# Patient Record
Sex: Female | Born: 1980
Health system: Southern US, Community
[De-identification: ages and names within clinical notes are randomized; demographics above are authoritative.]

## PROBLEM LIST (undated history)

## (undated) DIAGNOSIS — G43909 Migraine, unspecified, not intractable, without status migrainosus: Secondary | ICD-10-CM

## (undated) HISTORY — PX: TUBAL LIGATION: SHX77

## (undated) HISTORY — PX: LEEP: SHX91

---

## 2010-07-22 ENCOUNTER — Inpatient Hospital Stay (HOSPITAL_COMMUNITY)
Admission: AD | Admit: 2010-07-22 | Discharge: 2010-07-24 | DRG: 767 | Disposition: A | Discharge status: Home / Self Care | Payer: Medicaid Other | Source: Ambulatory Visit | Attending: Obstetrics and Gynecology | Admitting: Obstetrics and Gynecology

## 2010-07-22 DIAGNOSIS — Z22330 Carrier of Group B streptococcus: Secondary | ICD-10-CM

## 2010-07-22 DIAGNOSIS — Z302 Encounter for sterilization: Secondary | ICD-10-CM

## 2010-07-22 DIAGNOSIS — O99892 Other specified diseases and conditions complicating childbirth: Principal | ICD-10-CM | POA: Diagnosis present

## 2010-07-22 LAB — CBC
Hemoglobin: 11.3 g/dL — ABNORMAL LOW (ref 12.0–15.0)
MCV: 86.3 fL (ref 78.0–100.0)
Platelets: 306 10*3/uL (ref 150–400)
RBC: 4.02 MIL/uL (ref 3.87–5.11)
WBC: 6.6 10*3/uL (ref 4.0–10.5)

## 2010-07-22 LAB — RPR: RPR Ser Ql: NONREACTIVE

## 2010-07-23 LAB — CBC
Hemoglobin: 10.2 g/dL — ABNORMAL LOW (ref 12.0–15.0)
MCH: 27.9 pg (ref 26.0–34.0)
RBC: 3.65 MIL/uL — ABNORMAL LOW (ref 3.87–5.11)

## 2010-07-24 ENCOUNTER — Other Ambulatory Visit: Payer: Self-pay | Admitting: Obstetrics and Gynecology

## 2010-07-28 ENCOUNTER — Inpatient Hospital Stay (HOSPITAL_COMMUNITY): Admission: AD | Admit: 2010-07-28 | Payer: Self-pay | Admitting: Obstetrics and Gynecology

## 2010-07-31 NOTE — Op Note (Signed)
  Wendy Robertson, Wendy Robertson                 ACCOUNT NO.:  [id number]  MEDICAL RECORD NO.:  [id number]           PATIENT TYPE:  I  LOCATION:  9114                          FACILITY:  [facility]  PHYSICIAN:  Crist Fat. Haruki Arnold, M.D. DATE OF BIRTH:  10/03/1980  DATE OF PROCEDURE:  07/22/2010 DATE OF DISCHARGE:                              OPERATIVE REPORT   PREOPERATIVE DIAGNOSIS:  Desire for sterilization.  POSTOPERATIVE DIAGNOSIS:  Desire for sterilization.  ANESTHESIA:  General, Dr. Cristela Blue.  PROCEDURE:  Postpartum bilateral tubal ligation.  SURGEON:  Crist Fat. Major Santerre, MD  No assistant.  ESTIMATED BLOOD LOSS:  Minimal.  PROCEDURE:  After being informed of the planned procedure with possible complications including bleeding, infection, injury to other organs as well as failure rate of 1 in 500, informed consent was obtained.  The patient was taken to OR #4, given general anesthesia with endotracheal intubation without any complication.  She was placed in the dorsal decubitus position, prepped and draped in a sterile fashion and a Foley catheter was inserted in her bladder.  We infiltrated the umbilical area with 10 mL of Marcaine 0.25 and performed a semi-elliptical incision which was brought down bluntly to the fascia.  The fascia was identified, grasped with Kocher forceps, incised with Mayo scissors.  Peritoneum was entered bluntly.  We identified initially the left tube which was grabbed with a Babcock forceps, and completely exteriorized until the fimbriae was identified. We then opened the mesosalpinx with cauterization, doubly ligated the proximal stump, doubly ligated the distal stump with 0 chromic and removed a section of 1.5 cm of the isthmic ampullary area.  Both stumps were then cauterized, hemostasis was adequate.  We proceeded in the same fashion on the right exteriorizing the tube until the fimbriae was identified, opening the mesosalpinx with cauterization doubly  ligating the proximal and doubly ligating the distal portion with 0 chromic and cauterizing both stumps.  Hemostasis was also adequate.  The fascia was then closed with a running suture of 0 Vicryl and the skin was closed with Dermabond.  Instruments and sponge count was complete x2.  Estimated blood loss was minimal.  The procedure was well tolerated by the patient who was taken to recovery room in a well and stable condition.  SPECIMEN:  Segments of tube sent to Pathology.     Crist Fat Annelyse Rey, M.D.     SAR/MEDQ  D:  07/22/2010  T:  07/23/2010  Job:  [id number]  Electronically Signed by Silverio Lay M.D. on 07/31/2010 10:39:05 AM

## 2010-08-10 NOTE — Discharge Summary (Signed)
  Wendy Robertson, Wendy Robertson                 ACCOUNT NO.:  [id number]  MEDICAL RECORD NO.:  [id number]           PATIENT TYPE:  I  LOCATION:  9114                          FACILITY:  [facility]  PHYSICIAN:  Osborn Coho, M.D.   DATE OF BIRTH:  [birth year]  DATE OF ADMISSION:  07/22/2010 DATE OF DISCHARGE:  07/24/2010                              DISCHARGE SUMMARY   ADMITTING DIAGNOSIS:  The patient is a 30 year old gravida 7, para 4-1-1- 4, who presents at 65 weeks' in active labor.  The patient was on 32 PE during her pregnancy.  DISCHARGE DIAGNOSIS:  Status post spontaneous vaginal delivery of a viable female infant named Alexis by Elsie Ra, certified nurse midwife, Apgars were 9 and 9, weight was 8 pounds 8 ounces.  The patient then received a bilateral tubal ligation per Dr. Estanislado Pandy.  HOSPITAL COURSE:  Postpartum course was routine, although the patient did have some drainage from her incision site this appears to have resolved.  The patient was stable upon discharge.  Her instructions are per [facility] booklet.  DISCHARGE MEDICATIONS:  Motrin and Percocet as well as Sudafed and a prenatal vitamin.    ______________________________ Sanda Klein, CNM   ______________________________ Osborn Coho, M.D.    SL/MEDQ  D:  07/24/2010  T:  07/25/2010  Job:  [id number]  Electronically Signed by Sanda Klein CNM on 07/30/2010 12:11:20 AM Electronically Signed by Osborn Coho M.D. on 08/10/2010 09:50:17 AM

## 2011-09-16 ENCOUNTER — Emergency Department (HOSPITAL_COMMUNITY)
Admission: EM | Admit: 2011-09-16 | Discharge: 2011-09-16 | Disposition: A | Discharge status: Home / Self Care | Payer: 59 | Source: Home / Self Care | Attending: Emergency Medicine | Admitting: Emergency Medicine

## 2011-09-16 ENCOUNTER — Encounter (HOSPITAL_COMMUNITY): Payer: Self-pay | Admitting: Emergency Medicine

## 2011-09-16 DIAGNOSIS — S8390XA Sprain of unspecified site of unspecified knee, initial encounter: Secondary | ICD-10-CM

## 2011-09-16 DIAGNOSIS — IMO0002 Reserved for concepts with insufficient information to code with codable children: Secondary | ICD-10-CM

## 2011-09-16 HISTORY — DX: Migraine, unspecified, not intractable, without status migrainosus: G43.909

## 2011-09-16 MED ORDER — IBUPROFEN 600 MG PO TABS: 600.0000 mg | ORAL_TABLET | Freq: Four times a day (QID) | ORAL | Status: AC | PRN

## 2011-09-16 MED ORDER — HYDROCODONE-ACETAMINOPHEN 5-325 MG PO TABS: 2.0000 | ORAL_TABLET | ORAL | Status: AC | PRN

## 2011-09-16 NOTE — Discharge Instructions (Signed)
Take the medication as written. Take 1 gram of tylenol with the motrin up to 4 times a day as needed for pain and fever. This is an effective combination for pain. Take the hydrocodone/norco only for severe pain. Do not take the tylenol and hydrocodone/norcoas they both have tylenol in them and too much can hurt your liver. Return if you get worse, have a  fever >100.4, or for any concerns.   Go to www.goodrx.com to look up your medications. This will give you a list of where you can find your prescriptions at the most affordable prices.   

## 2011-09-16 NOTE — ED Provider Notes (Signed)
History     CSN: [id number]  Arrival date & time 09/16/11  1632   First MD Initiated Contact with Patient 09/16/11 1646      Chief Complaint  Patient presents with  . Leg Pain    (Consider location/radiation/quality/duration/timing/severity/associated sxs/prior treatment) HPI Comments: Patient states that she stepped in a hole yesterday, and fell. States she was trying to get her foot out of the hole, and felt a "pop" in the middle of her calf. Since then, reports pain with dorsi flexion, active inversion /eversion of the ankle. Pain is worse with ambulation. Reports that her foot feels like it is "going to sleep" when she is walking, or has her foot in a dependent position. This is better with elevation. She has tried ice. No calf swelling, foot pain, ankle pain, knee pain. No redness, bruising. No history of injury to the foot, ankle, knee. Patient is not taking medications a regular basis, but has not been on any antibiotics recently.   Patient is a 31 y.o. female presenting with leg pain. The history is provided by the patient. No language interpreter was used.  Leg Pain  The incident occurred yesterday. The incident occurred at home. The injury mechanism was a fall. The pain is present in the left leg. The quality of the pain is described as aching. Pertinent negatives include no numbness, no inability to bear weight, no loss of motion, no muscle weakness, no loss of sensation and no tingling. The symptoms are aggravated by activity, bearing weight and palpation. She has tried ice for the symptoms. The treatment provided mild relief.    Past Medical History  Diagnosis Date  . Migraines     Past Surgical History  Procedure Date  . Leep   . Tubal ligation     History reviewed. No pertinent family history.  History  Substance Use Topics  . Smoking status: Never Smoker   . Smokeless tobacco: Not on file  . Alcohol Use: No    OB History    Grav Para Term Preterm Abortions  TAB SAB Ect Mult Living                  Review of Systems  Constitutional: Negative for fever.  Gastrointestinal: Negative for nausea and vomiting.  Musculoskeletal: Positive for myalgias. Negative for joint swelling.  Skin: Negative for color change, pallor, rash and wound.  Neurological: Negative for tingling, weakness and numbness.    Allergies  Review of patient's allergies indicates no known allergies.  Home Medications   Current Outpatient Rx  Name Route Sig Dispense Refill  . HYDROCODONE-ACETAMINOPHEN 5-325 MG PO TABS Oral Take 2 tablets by mouth every 4 (four) hours as needed for pain. 20 tablet 0  . IBUPROFEN 600 MG PO TABS Oral Take 1 tablet (600 mg total) by mouth every 6 (six) hours as needed for pain. 30 tablet 0    BP 130/91  Pulse 85  Temp(Src) 98.4 F (36.9 C) (Oral)  Resp 17  SpO2 99%  LMP 08/28/2011  Physical Exam  Nursing note and vitals reviewed. Constitutional: She is oriented to person, place, and time. She appears well-developed and well-nourished. No distress.  HENT:  Head: Normocephalic and atraumatic.  Eyes: Conjunctivae and EOM are normal.  Neck: Normal range of motion.  Cardiovascular: Normal rate.   Pulmonary/Chest: Effort normal.  Abdominal: She exhibits no distension.  Musculoskeletal: Normal range of motion.       Left lower leg: She exhibits tenderness. She exhibits  no bony tenderness, no swelling and no edema.       Legs:      Calves symmetric. Pain with deep palpation midcalf, between the gastrocnemius muscles. Pain with dorsiflexion. No pain with plantar flexion. Minimal pain with passive ankle inversion/eversion. Thompson test negative. DP 2+. Sensation to light touch and temperature intact. tibia, fibula  nontender. No bruising, discoloration. Knee, ankle, foot exam normal.   Neurological: She is alert and oriented to person, place, and time.  Skin: Skin is warm and dry.  Psychiatric: She has a normal mood and affect. Her behavior  is normal. Judgment and thought content normal.    ED Course  Procedures (including critical care time)  Labs Reviewed - No data to display No results found.   1. Sprain of lower leg       MDM  Suspect soleus injury. No evidence of Achilles tendon, ankle, knee injury. Place in ASO to provide proprioceptive feedback, and will have her followup with [facility] for further evaluation. Home with NSAIDs, Norco. Patient agrees with plan.  Luiz Blare, MD 09/16/11 1816

## 2011-09-16 NOTE — ED Notes (Signed)
Stepped in a hole in the yard yesterday.  Felt pop in lower leg.  Has pain between knee, ankle , and calf of leg.  Pain has worsened today, the more active, the more pain

## 2011-09-19 ENCOUNTER — Encounter: Payer: Self-pay | Admitting: Sports Medicine

## 2011-09-19 ENCOUNTER — Ambulatory Visit (INDEPENDENT_AMBULATORY_CARE_PROVIDER_SITE_OTHER): Payer: 59 | Admitting: Sports Medicine

## 2011-09-19 VITALS — BP 129/88 | HR 92

## 2011-09-19 DIAGNOSIS — S86819A Strain of other muscle(s) and tendon(s) at lower leg level, unspecified leg, initial encounter: Secondary | ICD-10-CM

## 2011-09-19 DIAGNOSIS — S86119A Strain of other muscle(s) and tendon(s) of posterior muscle group at lower leg level, unspecified leg, initial encounter: Secondary | ICD-10-CM | POA: Insufficient documentation

## 2011-09-19 NOTE — Assessment & Plan Note (Signed)
Compression sleeve. Heel lift. Rehabilitation exercises. Continue analgesics as prescribed by urgent care. Return to clinic 3 weeks.

## 2011-09-19 NOTE — Progress Notes (Signed)
  Subjective:    Patient ID: Wendy Robertson, female    DOB: [birth year], 31 y.o.   MRN: [id number]  HPI This patient is a very pleasant 31 year old female who comes in after stepping in a hole several days ago. She experienced a hyperdorsiflexion injury, and felt a pop at her medial calf. She went to urgent care, was given an ankle stabilizing orthosis, as well as some analgesics. She's here for further evaluation. She localizes her pain to the musculotendinous junction of the medial head of the gastrocnemius, and notes it's worse with resisted plantar flexion.  Past medical history: None. Past surgical history: LEEP, bilateral tubal ligation. Family history: Positive for diabetes, and hypertension. Social history: Denies use of alcohol, tobacco, or drugs. Works in [employer]. Allergies: None.  Review of Systems    No fevers, chills, night sweats, weight loss, chest pain, or shortness of breath.  Social History: Non-smoker. Objective:   Physical Exam General:  Well developed, well nourished, and in no acute distress. Neuro:  Alert and oriented x3, extra-ocular muscles intact. Skin: Warm and dry, no rashes noted. Respiratory:  Not using accessory muscles, speaking in full sentences. Musculoskeletal: Left spaceAnkle: No visible erythema or swelling. Range of motion is full in all directions. Strength is 5/5 in all directions. Stable lateral and medial ligaments; squeeze test and kleiger test unremarkable; Talar dome nontender; No pain at base of 5th MT; No tenderness over cuboid; No tenderness over N spot or navicular prominence No tenderness on posterior aspects of lateral and medial malleolus No sign of peroneal tendon subluxations or tenderness to palpation Negative tarsal tunnel tinel's  Tender to palpation at the musculotendinous junction of the medial head of the gastrocnemius. Good strength to plantar flexion. Negative Thompson's test.  MSK ultrasound: Shows a large  split in the soleus muscle medially just deep to the musculotendinous junction of the gastrocnemius.     Assessment & Plan:

## 2011-10-17 ENCOUNTER — Ambulatory Visit (INDEPENDENT_AMBULATORY_CARE_PROVIDER_SITE_OTHER): Payer: 59 | Admitting: Sports Medicine

## 2011-10-17 ENCOUNTER — Encounter: Payer: Self-pay | Admitting: Sports Medicine

## 2011-10-17 VITALS — BP 122/87 | HR 105 | Ht 62.0 in | Wt 160.0 lb

## 2011-10-17 DIAGNOSIS — S86119A Strain of other muscle(s) and tendon(s) of posterior muscle group at lower leg level, unspecified leg, initial encounter: Secondary | ICD-10-CM

## 2011-10-17 DIAGNOSIS — S93409A Sprain of unspecified ligament of unspecified ankle, initial encounter: Secondary | ICD-10-CM | POA: Insufficient documentation

## 2011-10-17 DIAGNOSIS — S838X9A Sprain of other specified parts of unspecified knee, initial encounter: Secondary | ICD-10-CM

## 2011-10-17 MED ORDER — MELOXICAM 15 MG PO TABS: ORAL_TABLET | ORAL | Status: DC

## 2011-10-17 NOTE — Progress Notes (Signed)
Patient ID: Wendy Robertson, female   DOB: 11-26-1980, 31 y.o.   MRN: [id number]  Subjective:   CC: Followup of left soleus strain.  HPI: Wendy Robertson comes back, her calf was 100% better, unfortunately 3 days ago she slipped and fell twisting her left ankle, and reinjuring her calf. She localizes her ankle pain over the ATFL, and is worse with any type of weightbearing. There is no swelling, no radiation, the pain is sharp.    Past medical history, surgical history, family history, social history, allergies, and medications reviewed from the medical record and no changes needed.  Review of Systems: No fevers, chills, night sweats, weight loss, chest pain, or shortness of breath.    Objective:  General:  Well Developed, well nourished, and in no acute distress. Neuro:  Alert and oriented x3, extra-ocular muscles intact. Skin: Warm and dry, no rashes noted. Respiratory:  Not using accessory muscles, speaking in full sentences. Musculoskeletal: Calf is no longer tender to palpation at the gastro-soleus musculotendinous junction. Left Ankle: No visible erythema or swelling. Range of motion is full in all directions. Strength is 5/5 in all directions. Stable lateral and medial ligaments; squeeze test and kleiger test unremarkable; Talar dome nontender; No pain at base of 5th MT; No tenderness over cuboid; No tenderness over N spot or navicular prominence No tenderness on posterior aspects of lateral and medial malleolus No sign of peroneal tendon subluxations or tenderness to palpation Negative tarsal tunnel tinel's Able to walk 4 steps. Mild tenderness to palpation over the ATFL. Negative anterior drawer sign.   Assessment & Plan:

## 2011-10-17 NOTE — Assessment & Plan Note (Signed)
Got approx 100% better then slipped and reinjured. Cont calf sleeve. Adding meloxicam. RTC 4 weeks prn.

## 2011-10-17 NOTE — Assessment & Plan Note (Signed)
Recent s/p fall, 3d ago. ASO. Ankle rehab. Meloxicam. RTC 3-4 weeks prn for this.

## 2011-11-20 ENCOUNTER — Ambulatory Visit: Payer: 59 | Admitting: Sports Medicine

## 2011-12-05 ENCOUNTER — Encounter: Payer: Self-pay | Attending: "Endocrinology | Admitting: *Deleted

## 2011-12-05 DIAGNOSIS — Z713 Dietary counseling and surveillance: Secondary | ICD-10-CM

## 2011-12-11 NOTE — Progress Notes (Signed)
Patient attended the [facility]: Hypertension/High Cholesterol nutrition class on 12/05/11.  Topics covered include:   1. Complications of Hyperlipidemia and/or Hypertension. 2. Ways to reduce risk of heart disease.  3. Identifying fat and sodium content on food labels. 4. Ways to decrease sodium intake. 5. Optimal amount of daily saturated fat intake. 6. Optimal amount of daily sodium intake.  7. Foods to limit/avoid on a heart healthy diet.  Patient to follow-up with [facility] prn.  

## 2012-01-08 ENCOUNTER — Ambulatory Visit: Payer: 59 | Admitting: Obstetrics and Gynecology

## 2012-02-01 ENCOUNTER — Encounter: Payer: Self-pay | Admitting: Obstetrics and Gynecology

## 2012-02-01 ENCOUNTER — Ambulatory Visit (INDEPENDENT_AMBULATORY_CARE_PROVIDER_SITE_OTHER): Payer: 59 | Admitting: Obstetrics and Gynecology

## 2012-02-01 VITALS — BP 110/70 | HR 70 | Resp 16 | Ht 62.0 in | Wt 164.0 lb

## 2012-02-01 DIAGNOSIS — N643 Galactorrhea not associated with childbirth: Secondary | ICD-10-CM

## 2012-02-01 DIAGNOSIS — Z01419 Encounter for gynecological examination (general) (routine) without abnormal findings: Secondary | ICD-10-CM

## 2012-02-01 DIAGNOSIS — Z124 Encounter for screening for malignant neoplasm of cervix: Secondary | ICD-10-CM

## 2012-02-01 DIAGNOSIS — Z139 Encounter for screening, unspecified: Secondary | ICD-10-CM

## 2012-02-01 LAB — CBC
MCH: 29.7 pg (ref 26.0–34.0)
MCHC: 32.9 g/dL (ref 30.0–36.0)
MCV: 90.2 fL (ref 78.0–100.0)
Platelets: 365 10*3/uL (ref 150–400)
RDW: 14.5 % (ref 11.5–15.5)
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
## 2012-02-04 LAB — PAP IG W/ RFLX HPV ASCU

## 2012-06-28 ENCOUNTER — Other Ambulatory Visit: Payer: Self-pay

## 2012-09-01 ENCOUNTER — Emergency Department (HOSPITAL_COMMUNITY): Payer: 59

## 2012-09-01 ENCOUNTER — Emergency Department (HOSPITAL_COMMUNITY)
Admission: EM | Admit: 2012-09-01 | Discharge: 2012-09-01 | Disposition: A | Discharge status: Home / Self Care | Payer: 59 | Attending: Emergency Medicine | Admitting: Emergency Medicine

## 2012-09-01 ENCOUNTER — Encounter (HOSPITAL_COMMUNITY): Payer: Self-pay | Admitting: *Deleted

## 2012-09-01 DIAGNOSIS — R079 Chest pain, unspecified: Secondary | ICD-10-CM

## 2012-09-01 DIAGNOSIS — Z8679 Personal history of other diseases of the circulatory system: Secondary | ICD-10-CM | POA: Insufficient documentation

## 2012-09-01 DIAGNOSIS — Y9389 Activity, other specified: Secondary | ICD-10-CM | POA: Insufficient documentation

## 2012-09-01 DIAGNOSIS — IMO0002 Reserved for concepts with insufficient information to code with codable children: Secondary | ICD-10-CM | POA: Insufficient documentation

## 2012-09-01 DIAGNOSIS — Y929 Unspecified place or not applicable: Secondary | ICD-10-CM | POA: Insufficient documentation

## 2012-09-01 LAB — POCT I-STAT, CHEM 8
Creatinine, Ser: 0.6 mg/dL (ref 0.50–1.10)
Glucose, Bld: 99 mg/dL (ref 70–99)
HCT: 43 % (ref 36.0–46.0)
Hemoglobin: 14.6 g/dL (ref 12.0–15.0)
TCO2: 24 mmol/L (ref 0–100)

## 2012-09-01 LAB — D-DIMER, QUANTITATIVE (NOT AT ARMC): D-Dimer, Quant: <0.27 ug/mL-FEU (ref 0.00–0.48)
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
## 2012-11-01 ENCOUNTER — Emergency Department (HOSPITAL_COMMUNITY)
Admission: EM | Admit: 2012-11-01 | Discharge: 2012-11-01 | Disposition: A | Discharge status: Home / Self Care | Payer: 59 | Source: Home / Self Care | Attending: Emergency Medicine | Admitting: Emergency Medicine

## 2012-11-01 ENCOUNTER — Emergency Department (INDEPENDENT_AMBULATORY_CARE_PROVIDER_SITE_OTHER): Payer: 59

## 2012-11-01 ENCOUNTER — Encounter (HOSPITAL_COMMUNITY): Payer: Self-pay | Admitting: Emergency Medicine

## 2012-11-01 DIAGNOSIS — M25579 Pain in unspecified ankle and joints of unspecified foot: Secondary | ICD-10-CM

## 2012-11-01 MED ORDER — HYDROCODONE-ACETAMINOPHEN 5-325 MG PO TABS: ORAL_TABLET | ORAL | Status: DC

## 2012-11-01 MED ORDER — IBUPROFEN 800 MG PO TABS
ORAL_TABLET | ORAL | Status: AC
  Filled 2012-11-01: qty 1

## 2012-11-01 MED ORDER — IBUPROFEN 800 MG PO TABS
800.0000 mg | ORAL_TABLET | Freq: Once | ORAL | Status: AC
  Administered 2012-11-01: 800 mg via ORAL

## 2012-11-01 MED ORDER — DICLOFENAC SODIUM 75 MG PO TBEC: 75.0000 mg | DELAYED_RELEASE_TABLET | Freq: Two times a day (BID) | ORAL | Status: DC

## 2012-11-01 NOTE — ED Notes (Signed)
Pt c/o bilateral ankle pain onset 6/13... Denies: inj/trauma... Reports recently having started to work out at the gym... Both ankles swell after a long day at work... She is alert and oriented w/no signs of acute distress.

## 2012-11-01 NOTE — ED Provider Notes (Signed)
Chief Complaint:   Chief Complaint  Patient presents with  . Ankle Pain    History of Present Illness:   Wendy Robertson is a 32 year old female who's had a one-week history of pain in both of her ankles, worse on the left than the right. She denies any injury, although she has been working out a lot, I think she may have been a little extra stress on her ankles and feet. She has swelling of both ankles although mostly on the left. It hurts to walk or to bear weight. It also hurts to move the ankle. She denies any other joint pains. She's had no systemic symptoms such as fever, chills, skin rash, adenopathy, conjunctivitis, abdominal pain, diarrhea, or urinary symptoms.  Review of Systems:  Other than noted above, the patient denies any of the following symptoms: Systemic:  No fevers, chills, sweats, or aches.  No fatigue or tiredness. Musculoskeletal:  No joint pain, arthritis, bursitis, swelling, back pain, or neck pain. Neurological:  No muscular weakness, paresthesias, headache, or trouble with speech or coordination.  No dizziness.  PMFSH:  Past medical history, family history, social history, meds, and allergies were reviewed.    Physical Exam:   Vital signs:  BP 149/72  Pulse 90  Temp(Src) 98.7 F (37.1 C) (Oral)  Resp 14  SpO2 96%  LMP 10/09/2012 Gen:  Alert and oriented times 3.  In no distress. Musculoskeletal: There was no visible swelling of the ankles, erythema, or heat. There is tenderness to palpation over the ankle joints, left more so than right. It hurts to move the left ankle joint and she hasn't decreased range of motion with pain. She has minor pain on movement of the right ankle joint and a full range of motion on that side.  Otherwise, all joints had a full a ROM with no swelling, bruising or deformity.  No edema, pulses full. Extremities were warm and pink.  Capillary refill was brisk.  Skin:  Clear, warm and dry.  No rash. Neuro:  Alert and oriented times 3.  Muscle  strength was normal.  Sensation was intact to light touch.   Radiology:  Dg Ankle Complete Left  11/01/2012   *RADIOLOGY REPORT*  Clinical Data: Ankle pain  LEFT ANKLE COMPLETE - 3+ VIEW  Comparison: None.  Findings: There is no evidence of fracture or dislocation.  There is no evidence of arthropathy or other focal bone abnormality. Soft tissues are unremarkable.  IMPRESSION: Negative exam.   Original Report Authenticated By: Signa Kell, M.D.   Dg Ankle Complete Right  11/01/2012   *RADIOLOGY REPORT*  Clinical Data:  Right ankle pain.  RIGHT ANKLE - COMPLETE 3+ VIEW  Comparison:  None.  Findings:  There is no evidence of fracture, dislocation, or joint effusion.  There is no evidence of arthropathy or other focal bone abnormality.  Soft tissues are unremarkable.  IMPRESSION: Negative.   Original Report Authenticated By: Irish Lack, M.D.   I reviewed the images independently and personally and concur with the radiologist's findings.  Course in Urgent Care Center:   Given ibuprofen 800 mg by mouth for the pain since she will be driving herself home. She was placed in a Cam Walker on the left side and given crutches for ambulation.  Assessment:  The encounter diagnosis was Ankle pain, unspecified laterality.  Ankle pain of uncertain cause possibly arthritis such as Reiter's syndrome, or other spondyloarthropathy, tendinitis, or stress fracture. She will need further evaluation and plans to see  her sports medicine specialist, Dr. Roanna Epley early next week.  Plan:   1.  The following meds were prescribed:   New Prescriptions   DICLOFENAC (VOLTAREN) 75 MG EC TABLET    Take 1 tablet (75 mg total) by mouth 2 (two) times daily.   HYDROCODONE-ACETAMINOPHEN (NORCO/VICODIN) 5-325 MG PER TABLET    1 to 2 tabs every 4 to 6 hours as needed for pain.   2.  The patient was instructed in symptomatic care, including rest and activity, elevation, application of ice and compression.  Appropriate handouts  were given. 3.  The patient was told to return if becoming worse in any way, if no better in 3 or 4 days, and given some red flag symptoms such as worsening pain or fever that would indicate earlier return.   4.  The patient was told to follow up with Dr. Darrick Penna next week.    Reuben Likes, MD 11/01/12 1345

## 2012-11-05 ENCOUNTER — Ambulatory Visit (INDEPENDENT_AMBULATORY_CARE_PROVIDER_SITE_OTHER): Payer: 59 | Admitting: Sports Medicine

## 2012-11-05 ENCOUNTER — Encounter: Payer: Self-pay | Admitting: Sports Medicine

## 2012-11-05 VITALS — BP 124/80 | HR 97 | Ht 62.0 in | Wt 166.0 lb

## 2012-11-05 DIAGNOSIS — IMO0002 Reserved for concepts with insufficient information to code with codable children: Secondary | ICD-10-CM

## 2012-11-05 DIAGNOSIS — I83893 Varicose veins of bilateral lower extremities with other complications: Secondary | ICD-10-CM | POA: Insufficient documentation

## 2012-11-05 DIAGNOSIS — S86119S Strain of other muscle(s) and tendon(s) of posterior muscle group at lower leg level, unspecified leg, sequela: Secondary | ICD-10-CM

## 2012-11-05 DIAGNOSIS — M79609 Pain in unspecified limb: Secondary | ICD-10-CM

## 2012-11-05 DIAGNOSIS — M79661 Pain in right lower leg: Secondary | ICD-10-CM | POA: Insufficient documentation

## 2012-11-05 NOTE — Assessment & Plan Note (Signed)
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

## 2012-11-05 NOTE — Progress Notes (Signed)
Patient ID: Wendy Robertson, female   DOB: 03/26/1981, 32 y.o.   MRN: [id number]  Patient completed an insanity workout at home. She were not getting bilateral ankle pain and swelling. [facility]. She was put in a Cam Walker because her pain particularly on her left ankle and leg was pretty intense. This has gradually lessened over the last week. She comes today for evaluation.  Last year I had seen her for a tear of her soleus muscle on the left that was fairly large. That one arose after some step up exercises. She did get better fairly quickly from that injury.  Possible pertinent history is that she's had 5 children with the last being 59 years of age. She has had trouble maintaining fitness with her busy schedule. Weight is significantly above her baseline. There is a history of significant varicose veins particularly in her grandmother.  Examination  No acute distress  Ankle bilaterally Mild  swelling. Range of motion is full in all directions. Strength is 5/5 in all directions. Stable lateral and medial ligaments; squeeze test and kleiger test unremarkable; Talar dome nontender; No pain at base of 5th MT; No tenderness over cuboid; No tenderness over N spot or navicular prominence No tenderness on posterior aspects of lateral and medial malleolus. There is more swelling over the posterior tibial tendon sheaths than is noted laterally No sign of peroneal tendon subluxations; Negative tarsal tunnel tinel's Able to walk 4 steps.  She still has some pain when stepping up on a step with either leg.  No palpable tenderness in the calf. Varicosities are noted in both calves.  Ultrasound examination  Medial side of the lower leg as the soleus muscle there is a significant varicosity bilaterally There is hypoechoic change at the medial and lateral he had of the gastrocnemius muscle on the left There is hypoechoic change / fluid outlining the fascial  layer deep to the medial gastroc on the left The soleus muscle looks intact but has some hypoechoic change in the lateral aspect of the left There are scattered varicosities Achilles tendon is intact  On right calf the medial and lateral gastroc look intact The soleus and Achilles tendon look intact There are scattered varicosities  Swelling is noted into the posterior tibialis sheath bilaterally

## 2012-11-05 NOTE — Assessment & Plan Note (Signed)
Patient has had problems with the calf muscles on left 4 year and now having trouble with the right calf as well  I think the ankle swelling came from the calf injury She does have evidence of muscle strain I am concerned that it may be the varicosities that get injured and caused some bleeding into her calves on both sides  We will place compression sleeves on both calves Heel lifts Start with gentle rehabilitation exercises and progress is as tolerated Recheck in 6 weeks

## 2012-12-10 ENCOUNTER — Ambulatory Visit: Payer: 59 | Admitting: Sports Medicine

## 2013-02-04 ENCOUNTER — Encounter (HOSPITAL_COMMUNITY): Payer: Self-pay | Admitting: Emergency Medicine

## 2013-02-04 ENCOUNTER — Emergency Department (HOSPITAL_COMMUNITY)
Admission: EM | Admit: 2013-02-04 | Discharge: 2013-02-04 | Disposition: A | Discharge status: Home / Self Care | Payer: 59 | Source: Home / Self Care | Attending: Family Medicine | Admitting: Family Medicine

## 2013-02-04 DIAGNOSIS — N390 Urinary tract infection, site not specified: Secondary | ICD-10-CM

## 2013-02-04 LAB — POCT URINALYSIS DIP (DEVICE)
Bilirubin Urine: NEGATIVE
Ketones, ur: NEGATIVE mg/dL
Protein, ur: 100 mg/dL — AB
Specific Gravity, Urine: >=1.03 (ref 1.005–1.030)
pH: 6 (ref 5.0–8.0)

## 2013-02-04 MED ORDER — ONDANSETRON 4 MG PO TBDP
ORAL_TABLET | ORAL | Status: AC
  Filled 2013-02-04: qty 1

## 2013-02-04 MED ORDER — CEFTRIAXONE SODIUM 1 G IJ SOLR
INTRAMUSCULAR | Status: AC
  Filled 2013-02-04: qty 10

## 2013-02-04 MED ORDER — ONDANSETRON 4 MG PO TBDP
4.0000 mg | ORAL_TABLET | Freq: Once | ORAL | Status: AC
  Administered 2013-02-04: 4 mg via ORAL

## 2013-02-04 MED ORDER — CEFTRIAXONE SODIUM 1 G IJ SOLR
1.0000 g | Freq: Once | INTRAMUSCULAR | Status: AC
  Administered 2013-02-04: 1 g via INTRAMUSCULAR

## 2013-02-04 MED ORDER — CEPHALEXIN 500 MG PO CAPS: 500.0000 mg | ORAL_CAPSULE | Freq: Four times a day (QID) | ORAL | Status: DC

## 2013-02-04 MED ORDER — LIDOCAINE HCL (PF) 1 % IJ SOLN
INTRAMUSCULAR | Status: AC
  Filled 2013-02-04: qty 5

## 2013-02-04 NOTE — ED Notes (Signed)
Pt c/o lower back pain. Denies n/v/d. Pt is alert and oriented.

## 2013-02-04 NOTE — ED Provider Notes (Signed)
CSN: [id number]     Arrival date & time 02/04/13  1834 History   First MD Initiated Contact with Patient 02/04/13 1923     No chief complaint on file.  (Consider location/radiation/quality/duration/timing/severity/associated sxs/prior Treatment) Patient is a 32 y.o. female presenting with back pain.  Back Pain Location:  Lumbar spine Quality:  Stabbing Radiates to:  Does not radiate Pain severity:  Moderate Onset quality:  Sudden Duration:  3 hours Timing:  Constant Chronicity:  New Context: not recent illness and not recent injury   Associated symptoms: no abdominal pain, no chest pain, no dysuria, no fever and no pelvic pain     Past Medical History  Diagnosis Date  . Migraines    Past Surgical History  Procedure Laterality Date  . Leep    . Tubal ligation     No family history on file. History  Substance Use Topics  . Smoking status: Never Smoker   . Smokeless tobacco: Never Used  . Alcohol Use: No   OB History   Grav Para Term Preterm Abortions TAB SAB Ect Mult Living                 Review of Systems  Constitutional: Negative.  Negative for fever.  HENT: Negative.   Cardiovascular: Negative for chest pain.  Gastrointestinal: Positive for nausea. Negative for vomiting and abdominal pain.  Genitourinary: Positive for flank pain. Negative for dysuria, frequency, hematuria, vaginal bleeding, vaginal discharge, menstrual problem and pelvic pain.  Musculoskeletal: Negative for back pain.    Allergies  Review of patient's allergies indicates no known allergies.  Home Medications   Current Outpatient Rx  Name  Route  Sig  Dispense  Refill  . cephALEXin (KEFLEX) 500 MG capsule   Oral   Take 1 capsule (500 mg total) by mouth 4 (four) times daily. Take all of medicine and drink lots of fluids   20 capsule   0   . diclofenac (VOLTAREN) 75 MG EC tablet   Oral   Take 1 tablet (75 mg total) by mouth 2 (two) times daily.   20 tablet   0    There were no  vitals taken for this visit. Physical Exam  Nursing note and vitals reviewed. Constitutional: She is oriented to person, place, and time. She appears well-developed and well-nourished.  Neck: Normal range of motion. Neck supple.  Pulmonary/Chest: Breath sounds normal.  Abdominal: Soft. Normal appearance and bowel sounds are normal. There is no hepatosplenomegaly. There is tenderness. There is CVA tenderness. There is no rigidity, no rebound, no guarding, no tenderness at McBurney's point and negative Murphy's sign.  Neurological: She is alert and oriented to person, place, and time.  Skin: Skin is warm and dry.    ED Course  Procedures (including critical care time) Labs Review Labs Reviewed  POCT URINALYSIS DIP (DEVICE) - Abnormal; Notable for the following:    Hgb urine dipstick MODERATE (*)    Protein, ur 100 (*)    Leukocytes, UA MODERATE (*)    All other components within normal limits  URINE CULTURE   Imaging Review No results found.  MDM      Linna Hoff, MD 02/04/13 [phone]

## 2013-02-06 LAB — URINE CULTURE: Special Requests: NORMAL

## 2013-02-07 NOTE — ED Notes (Signed)
Patient final report of UA culture and sens. Shows klebsiella pneumonia, sens. To Rocephin and keflex rx administered to patient.

## 2013-03-19 ENCOUNTER — Other Ambulatory Visit: Payer: Self-pay

## 2013-09-03 ENCOUNTER — Encounter (HOSPITAL_COMMUNITY): Payer: Self-pay | Admitting: Emergency Medicine

## 2013-09-03 ENCOUNTER — Emergency Department (HOSPITAL_COMMUNITY)
Admission: EM | Admit: 2013-09-03 | Discharge: 2013-09-03 | Disposition: A | Discharge status: Home / Self Care | Payer: 59 | Attending: Emergency Medicine | Admitting: Emergency Medicine

## 2013-09-03 ENCOUNTER — Emergency Department (HOSPITAL_COMMUNITY)
Admission: EM | Admit: 2013-09-03 | Discharge: 2013-09-03 | Disposition: A | Discharge status: Other Acute Inpatient Hospital | Payer: 59 | Source: Home / Self Care | Attending: Family Medicine | Admitting: Family Medicine

## 2013-09-03 ENCOUNTER — Emergency Department (INDEPENDENT_AMBULATORY_CARE_PROVIDER_SITE_OTHER): Payer: 59

## 2013-09-03 DIAGNOSIS — R0789 Other chest pain: Secondary | ICD-10-CM

## 2013-09-03 DIAGNOSIS — R0682 Tachypnea, not elsewhere classified: Secondary | ICD-10-CM

## 2013-09-03 DIAGNOSIS — F419 Anxiety disorder, unspecified: Secondary | ICD-10-CM

## 2013-09-03 DIAGNOSIS — R079 Chest pain, unspecified: Secondary | ICD-10-CM

## 2013-09-03 DIAGNOSIS — R0602 Shortness of breath: Secondary | ICD-10-CM

## 2013-09-03 DIAGNOSIS — R209 Unspecified disturbances of skin sensation: Secondary | ICD-10-CM | POA: Insufficient documentation

## 2013-09-03 DIAGNOSIS — Z8679 Personal history of other diseases of the circulatory system: Secondary | ICD-10-CM | POA: Insufficient documentation

## 2013-09-03 DIAGNOSIS — F411 Generalized anxiety disorder: Secondary | ICD-10-CM | POA: Insufficient documentation

## 2013-09-03 LAB — BASIC METABOLIC PANEL
BUN: 12 mg/dL (ref 6–23)
CHLORIDE: 102 meq/L (ref 96–112)
CO2: 20 mEq/L (ref 19–32)
Calcium: 9.5 mg/dL (ref 8.4–10.5)
Creatinine, Ser: 0.57 mg/dL (ref 0.50–1.10)
GFR calc non Af Amer: >90 mL/min (ref >90)
Glucose, Bld: 89 mg/dL (ref 70–99)
Potassium: 3.8 mEq/L (ref 3.7–5.3)
Sodium: 138 mEq/L (ref 137–147)

## 2013-09-03 LAB — POCT I-STAT, CHEM 8
BUN: 12 mg/dL (ref 6–23)
CHLORIDE: 106 meq/L (ref 96–112)
CREATININE: 0.6 mg/dL (ref 0.50–1.10)
Calcium, Ion: 1.17 mmol/L (ref 1.12–1.23)
GLUCOSE: 97 mg/dL (ref 70–99)
HCT: 43 % (ref 36.0–46.0)
Hemoglobin: 14.6 g/dL (ref 12.0–15.0)
POTASSIUM: 3.8 meq/L (ref 3.7–5.3)
Sodium: 141 mEq/L (ref 137–147)
TCO2: 21 mmol/L (ref 0–100)

## 2013-09-03 LAB — CBC WITH DIFFERENTIAL/PLATELET
BASOS PCT: 0 % (ref 0–1)
Basophils Absolute: 0 10*3/uL (ref 0.0–0.1)
EOS ABS: 0.1 10*3/uL (ref 0.0–0.7)
Eosinophils Relative: 1 % (ref 0–5)
HEMATOCRIT: 40.8 % (ref 36.0–46.0)
HEMOGLOBIN: 13.8 g/dL (ref 12.0–15.0)
Lymphocytes Relative: 26 % (ref 12–46)
Lymphs Abs: 1.9 10*3/uL (ref 0.7–4.0)
MCH: 30.3 pg (ref 26.0–34.0)
MCHC: 33.8 g/dL (ref 30.0–36.0)
MCV: 89.7 fL (ref 78.0–100.0)
MONOS PCT: 6 % (ref 3–12)
Monocytes Absolute: 0.4 10*3/uL (ref 0.1–1.0)
Neutro Abs: 4.9 10*3/uL (ref 1.7–7.7)
Neutrophils Relative %: 67 % (ref 43–77)
Platelets: 366 10*3/uL (ref 150–400)
RBC: 4.55 MIL/uL (ref 3.87–5.11)
RDW: 14.1 % (ref 11.5–15.5)
WBC: 7.3 10*3/uL (ref 4.0–10.5)

## 2013-09-03 LAB — D-DIMER, QUANTITATIVE: D-Dimer, Quant: 0.27 ug/mL-FEU (ref 0.00–0.48)

## 2013-09-03 LAB — TROPONIN I: Troponin I: <0.3 ng/mL (ref <0.30)

## 2013-09-03 MED ORDER — LORAZEPAM 1 MG PO TABS
1.0000 mg | ORAL_TABLET | Freq: Once | ORAL | Status: AC
  Administered 2013-09-03: 1 mg via ORAL
  Filled 2013-09-03: qty 1

## 2013-09-03 MED ORDER — HYDROXYZINE HCL 25 MG PO TABS: 25.0000 mg | ORAL_TABLET | Freq: Four times a day (QID) | ORAL | Status: DC | PRN

## 2013-09-03 NOTE — ED Provider Notes (Signed)
CSN: [id number]     Arrival date & time 09/03/13  [id number] History   First MD Initiated Contact with Patient 09/03/13 [phone]     Chief Complaint  Patient presents with  . Shortness of Breath   (Consider location/radiation/quality/duration/timing/severity/associated sxs/prior Treatment) HPI Comments: 33 year old female presents complaining of shortness of breath. This began this morning at 6 AM, approximately 3 hours ago. She started feeling like her chest was tight and it was difficult to breathe. This feeling has persisted up until now. She is also had a slight feeling of weakness. She describes this as feeling like her entire body is very tired like she is just done an intense workout. The feelings get worse with any exertion such as walking around. She has had this once before in the past but it only lasted a few minutes so this is very abnormal for her. She has no significant past medical history. She has no significant family history. She has no recent travel or sick contacts. She denies nausea, vomiting, fever, chest pain, leg swelling  Patient is a 33 y.o. female presenting with shortness of breath.  Shortness of Breath Associated symptoms: no abdominal pain, no chest pain, no cough, no fever, no rash, no sore throat, no vomiting and no wheezing     Past Medical History  Diagnosis Date  . Migraines    Past Surgical History  Procedure Laterality Date  . Leep    . Tubal ligation     History reviewed. No pertinent family history. History  Substance Use Topics  . Smoking status: Never Smoker   . Smokeless tobacco: Never Used  . Alcohol Use: No   OB History   Grav Para Term Preterm Abortions TAB SAB Ect Mult Living                 Review of Systems  Constitutional: Negative for fever and chills.  HENT: Negative for sore throat.   Eyes: Negative for visual disturbance.  Respiratory: Positive for chest tightness and shortness of breath. Negative for cough and wheezing.    Cardiovascular: Negative for chest pain, palpitations and leg swelling.  Gastrointestinal: Negative for nausea, vomiting, abdominal pain, diarrhea and blood in stool.  Endocrine: Negative for polydipsia and polyuria.  Genitourinary: Negative for dysuria, urgency and frequency.  Musculoskeletal: Negative for arthralgias, myalgias and neck stiffness.  Skin: Negative for color change and rash.  Neurological: Positive for weakness. Negative for dizziness and light-headedness.  All other systems reviewed and are negative.   Allergies  Review of patient's allergies indicates no known allergies.  Home Medications   Prior to Admission medications   Medication Sig Start Date End Date Taking? Authorizing Provider  cephALEXin (KEFLEX) 500 MG capsule Take 1 capsule (500 mg total) by mouth 4 (four) times daily. Take all of medicine and drink lots of fluids 02/04/13   Linna HoffJames D Kindl, MD  diclofenac (VOLTAREN) 75 MG EC tablet Take 1 tablet (75 mg total) by mouth 2 (two) times daily. 11/01/12   Reuben Likesavid C Keller, MD   BP 143/64  Pulse 97  Temp(Src) 98.7 F (37.1 C) (Oral)  Resp 16  SpO2 100%  LMP 08/31/2013 Physical Exam  Nursing note and vitals reviewed. Constitutional: She is oriented to person, place, and time. Vital signs are normal. She appears well-developed and well-nourished. She is active.  Non-toxic appearance. She does not appear ill. No distress.  HENT:  Head: Normocephalic and atraumatic.  Right Ear: External ear normal.  Left Ear: External ear  normal.  Nose: Nose normal.  Mouth/Throat: Uvula is midline, oropharynx is clear and moist and mucous membranes are normal. No oropharyngeal exudate.  Neck: No JVD present.  Cardiovascular: Normal rate, regular rhythm, normal heart sounds, intact distal pulses and normal pulses.   Pulmonary/Chest: Breath sounds normal. No accessory muscle usage. Tachypnea noted. No respiratory distress.  Neurological: She is alert and oriented to person, place,  and time. She has normal strength and normal reflexes. No cranial nerve deficit or sensory deficit. She exhibits normal muscle tone. She displays a negative Romberg sign. Coordination and gait normal.  Left pupil is slightly dilated as compared to the right but is round and reactive to light equally bilaterally  Skin: Skin is warm and dry. No rash noted. She is not diaphoretic.  Psychiatric: She has a normal mood and affect. Judgment normal.    ED Course  Procedures (including critical care time) Labs Review Labs Reviewed  POCT I-STAT, CHEM 8    Results for orders placed during the hospital encounter of 09/03/13  POCT I-STAT, CHEM 8      Result Value Ref Range   Sodium 141  137 - 147 mEq/L   Potassium 3.8  3.7 - 5.3 mEq/L   Chloride 106  96 - 112 mEq/L   BUN 12  6 - 23 mg/dL   Creatinine, Ser 1.610.60  0.50 - 1.10 mg/dL   Glucose, Bld 97  70 - 99 mg/dL   Calcium, Ion 0.961.17  0.451.12 - 1.23 mmol/L   TCO2 21  0 - 100 mmol/L   Hemoglobin 14.6  12.0 - 15.0 g/dL   HCT 40.943.0  81.136.0 - 91.446.0 %   Imaging Review Dg Chest 2 View  09/03/2013   CLINICAL DATA:  Shortness of breath.  EXAM: CHEST  2 VIEW  COMPARISON:  09/01/2012  FINDINGS: The heart size and mediastinal contours are within normal limits. Both lungs are clear. The visualized skeletal structures are unremarkable.  IMPRESSION: No active cardiopulmonary disease.   Electronically Signed   By: Herbie BaltimoreWalt  Liebkemann M.D.   On: 09/03/2013 09:32    RR is 24   EKG  Date: 09/03/2013  Rate: 83  Rhythm: normal sinus rhythm  QRS Axis: normal  Intervals: normal  ST/T Wave abnormalities: normal  Conduction Disutrbances:none  Narrative Interpretation:   Old EKG Reviewed: unchanged    MDM   1. Shortness of breath   2. Tachypnea   3. Chest tightness     Patient with subjective shortness of breath I-STAT is normal, chest x-ray is normal, EKG is normal, cause undetermined. We'll transfer to the emergency department for further  evaluation.   Graylon GoodZachary H Susanna Benge, PA-C 09/03/13 [phone]

## 2013-09-03 NOTE — ED Provider Notes (Signed)
CSN: [id number]     Arrival date & time 09/03/13  1013 History   First MD Initiated Contact with Patient 09/03/13 1024     Chief Complaint  Patient presents with  . Shortness of Breath     (Consider location/radiation/quality/duration/timing/severity/associated sxs/prior Treatment) HPI Comments: Patient presents to the ER as a referral from urgent care. Patient reports that she awakened this morning with difficulty breathing. She feels a tightness in her chest and also reports that she feels like she cannot get air in and out appropriately. This has been ongoing since she awakened. She was here care, had an EKG and chest x-ray which were normal. She was referred to the ER for further evaluation. Upon arrival, symptoms are persistent.  Patient is a 33 y.o. female presenting with shortness of breath.  Shortness of Breath   Past Medical History  Diagnosis Date  . Migraines    Past Surgical History  Procedure Laterality Date  . Leep    . Tubal ligation     History reviewed. No pertinent family history. History  Substance Use Topics  . Smoking status: Never Smoker   . Smokeless tobacco: Never Used  . Alcohol Use: No   OB History   Grav Para Term Preterm Abortions TAB SAB Ect Mult Living                 Review of Systems  Respiratory: Positive for chest tightness and shortness of breath.   All other systems reviewed and are negative.     Allergies  Review of patient's allergies indicates no known allergies.  Home Medications   Prior to Admission medications   Not on File   BP 137/89  Pulse 90  Temp(Src) 97.7 F (36.5 C) (Oral)  Resp 16  Ht 5\' 3"  (1.6 m)  Wt 168 lb 12.8 oz (76.567 kg)  BMI 29.91 kg/m2  SpO2 100%  LMP 08/31/2013 Physical Exam  Constitutional: She is oriented to person, place, and time. She appears well-developed and well-nourished. No distress.  HENT:  Head: Normocephalic and atraumatic.  Right Ear: Hearing normal.  Left Ear: Hearing normal.   Nose: Nose normal.  Mouth/Throat: Oropharynx is clear and moist and mucous membranes are normal.  Eyes: Conjunctivae and EOM are normal. Pupils are equal, round, and reactive to light.  Neck: Normal range of motion. Neck supple.  Cardiovascular: Regular rhythm, S1 normal and S2 normal.  Exam reveals no gallop and no friction rub.   No murmur heard. Pulmonary/Chest: Breath sounds normal. Tachypnea noted. No respiratory distress. She exhibits no tenderness.  Abdominal: Soft. Normal appearance and bowel sounds are normal. There is no hepatosplenomegaly. There is no tenderness. There is no rebound, no guarding, no tenderness at McBurney's point and negative Murphy's sign. No hernia.  Musculoskeletal: Normal range of motion.  Neurological: She is alert and oriented to person, place, and time. She has normal strength. No cranial nerve deficit or sensory deficit. Coordination normal. GCS eye subscore is 4. GCS verbal subscore is 5. GCS motor subscore is 6.  Skin: Skin is warm, dry and intact. No rash noted. No cyanosis.  Psychiatric: She has a normal mood and affect. Her speech is normal and behavior is normal. Thought content normal.    ED Course  Procedures (including critical care time) Labs Review Labs Reviewed  CBC WITH DIFFERENTIAL  BASIC METABOLIC PANEL  TROPONIN I  D-DIMER, QUANTITATIVE    Imaging Review Dg Chest 2 View  09/03/2013   CLINICAL DATA:  Shortness  of breath.  EXAM: CHEST  2 VIEW  COMPARISON:  09/01/2012  FINDINGS: The heart size and mediastinal contours are within normal limits. Both lungs are clear. The visualized skeletal structures are unremarkable.  IMPRESSION: No active cardiopulmonary disease.   Electronically Signed   By: Herbie BaltimoreWalt  Liebkemann M.D.   On: 09/03/2013 09:32     EKG Interpretation None      MDM   Final diagnoses:  None    EKG from urgent care review, no acute ischemia or infarct. Chest x-ray also reviewed, was normal. Upon examination, lungs are  clear. She has normal air movement bilaterally with oxygenation of 100%. She appears extremely anxious, is slightly hyperventilating upon arrival.  Patient's troponin is negative. A d-dimer was also performed and is negative. Etiology of the symptoms is unclear at this time. She does not have any cardiac risk factors. Likewise, she is low risk for PE and has a negative d-dimer. Her Wells criteria were negative.  Patient now complaining intermittently of the right and then left hand and finger numbness and tingling. This is likely secondary to hyperventilation. Symptoms are likely secondary to anxiety, as her workup is entirely normal. Patient will be administered Ativan, will be discharged to followup with primary doctor. She is very low risk for acute cardiopulmonary disease and workup as appropriate as an outpatient.    Gilda Creasehristopher J. Pollina, MD 09/03/13 [phone]

## 2013-09-03 NOTE — ED Notes (Signed)
Sent from [facility] for further evaluation of SOB since waking this am. shes had a dry cough since yesterday. She denies pain. She had a cxr and labs there that were normal

## 2013-09-03 NOTE — ED Notes (Signed)
MD at bedside. 

## 2013-09-03 NOTE — Discharge Instructions (Signed)
Chest Pain (Nonspecific) °It is often hard to give a specific diagnosis for the cause of chest pain. There is always a chance that your pain could be related to something serious, such as a heart attack or a blood clot in the lungs. You need to follow up with your caregiver for further evaluation. °CAUSES  °· Heartburn. °· Pneumonia or bronchitis. °· Anxiety or stress. °· Inflammation around your heart (pericarditis) or lung (pleuritis or pleurisy). °· A blood clot in the lung. °· A collapsed lung (pneumothorax). It can develop suddenly on its own (spontaneous pneumothorax) or from injury (trauma) to the chest. °· Shingles infection (herpes zoster virus). °The chest wall is composed of bones, muscles, and cartilage. Any of these can be the source of the pain. °· The bones can be bruised by injury. °· The muscles or cartilage can be strained by coughing or overwork. °· The cartilage can be affected by inflammation and become sore (costochondritis). °DIAGNOSIS  °Lab tests or other studies, such as X-rays, electrocardiography, stress testing, or cardiac imaging, may be needed to find the cause of your pain.  °TREATMENT  °· Treatment depends on what may be causing your chest pain. Treatment may include: °· Acid blockers for heartburn. °· Anti-inflammatory medicine. °· Pain medicine for inflammatory conditions. °· Antibiotics if an infection is present. °· You may be advised to change lifestyle habits. This includes stopping smoking and avoiding alcohol, caffeine, and chocolate. °· You may be advised to keep your head raised (elevated) when sleeping. This reduces the chance of acid going backward from your stomach into your esophagus. °· Most of the time, nonspecific chest pain will improve within 2 to 3 days with rest and mild pain medicine. °HOME CARE INSTRUCTIONS  °· If antibiotics were prescribed, take your antibiotics as directed. Finish them even if you start to feel better. °· For the next few days, avoid physical  activities that bring on chest pain. Continue physical activities as directed. °· Do not smoke. °· Avoid drinking alcohol. °· Only take over-the-counter or prescription medicine for pain, discomfort, or fever as directed by your caregiver. °· Follow your caregiver's suggestions for further testing if your chest pain does not go away. °· Keep any follow-up appointments you made. If you do not go to an appointment, you could develop lasting (chronic) problems with pain. If there is any problem keeping an appointment, you must call to reschedule. °SEEK MEDICAL CARE IF:  °· You think you are having problems from the medicine you are taking. Read your medicine instructions carefully. °· Your chest pain does not go away, even after treatment. °· You develop a rash with blisters on your chest. °SEEK IMMEDIATE MEDICAL CARE IF:  °· You have increased chest pain or pain that spreads to your arm, neck, jaw, back, or abdomen. °· You develop shortness of breath, an increasing cough, or you are coughing up blood. °· You have severe back or abdominal pain, feel nauseous, or vomit. °· You develop severe weakness, fainting, or chills. °· You have a fever. °THIS IS AN EMERGENCY. Do not wait to see if the pain will go away. Get medical help at once. Call your local emergency services (911 in [place] Do not drive yourself to the hospital. °MAKE SURE YOU:  °· Understand these instructions. °· Will watch your condition. °· Will get help right away if you are not doing well or get worse. °Document Released: 02/07/2005 Document Revised: 07/23/2011 Document Reviewed: 12/04/2007 °ExitCare® Patient Information ©2014 [employer],   LLC. ° °Panic Attacks °Panic attacks are sudden, short-lived surges of severe anxiety, fear, or discomfort. They may occur for no reason when you are relaxed, when you are anxious, or when you are sleeping. Panic attacks may occur for a number of reasons:  °· Healthy people occasionally have panic attacks in extreme,  life-threatening situations, such as war or natural disasters. Normal anxiety is a protective mechanism of the body that helps us react to danger (fight or flight response). °· Panic attacks are often seen with anxiety disorders, such as panic disorder, social anxiety disorder, generalized anxiety disorder, and phobias. Anxiety disorders cause excessive or uncontrollable anxiety. They may interfere with your relationships or other life activities. °· Panic attacks are sometimes seen with other mental illnesses such as depression and posttraumatic stress disorder. °· Certain medical conditions, prescription medicines, and drugs of abuse can cause panic attacks. °SYMPTOMS  °Panic attacks start suddenly, peak within 20 minutes, and are accompanied by four or more of the following symptoms: °· Pounding heart or fast heart rate (palpitations). °· Sweating. °· Trembling or shaking. °· Shortness of breath or feeling smothered. °· Feeling choked. °· Chest pain or discomfort. °· Nausea or strange feeling in your stomach. °· Dizziness, lightheadedness, or feeling like you will faint. °· Chills or hot flushes. °· Numbness or tingling in your lips or hands and feet. °· Feeling that things are not real or feeling that you are not yourself. °· Fear of losing control or going crazy. °· Fear of dying. °Some of these symptoms can mimic serious medical conditions. For example, you may think you are having a heart attack. Although panic attacks can be very scary, they are not life threatening. °DIAGNOSIS  °Panic attacks are diagnosed through an assessment by your health care provider. Your health care provider will ask questions about your symptoms, such as where and when they occurred. Your health care provider will also ask about your medical history and use of alcohol and drugs, including prescription medicines. Your health care provider may order blood tests or other studies to rule out a serious medical condition. Your health  care provider may refer you to a mental health professional for further evaluation. °TREATMENT  °· Most healthy people who have one or two panic attacks in an extreme, life-threatening situation will not require treatment. °· The treatment for panic attacks associated with anxiety disorders or other mental illness typically involves counseling with a mental health professional, medicine, or a combination of both. Your health care provider will help determine what treatment is best for you. °· Panic attacks due to physical illness usually goes away with treatment of the illness. If prescription medicine is causing panic attacks, talk with your health care provider about stopping the medicine, decreasing the dose, or substituting another medicine. °· Panic attacks due to alcohol or drug abuse goes away with abstinence. Some adults need professional help in order to stop drinking or using drugs. °HOME CARE INSTRUCTIONS  °· Take all your medicines as prescribed.   °· Check with your health care provider before starting new prescription or over-the-counter medicines. °· Keep all follow up appointments with your health care provider. °SEEK MEDICAL CARE IF: °· You are not able to take your medicines as prescribed. °· Your symptoms do not improve or get worse. °SEEK IMMEDIATE MEDICAL CARE IF:  °· You experience panic attack symptoms that are different than your usual symptoms. °· You have serious thoughts about hurting yourself or others. °· You are taking medicine for   panic attacks and have a serious side effect. °MAKE SURE YOU: °· Understand these instructions. °· Will watch your condition. °· Will get help right away if you are not doing well or get worse. °Document Released: 04/30/2005 Document Revised: 02/18/2013 Document Reviewed: 12/12/2012 °ExitCare® Patient Information ©2014 Exit[employer], LLC. ° °

## 2013-09-03 NOTE — ED Notes (Signed)
States AM was usual, but after being up for a while, began to have SOB. SOB worse w  exertion. Denies nausea or sweats. Denies leg pain or recent extended trip. W/D, color good

## 2013-09-03 NOTE — ED Provider Notes (Signed)
Medical screening examination/treatment/procedure(s) were performed by a resident physician or non-physician practitioner and as the supervising physician I was immediately available for consultation/collaboration.  Maclaine Ahola, MD    Lucian Baswell S Nolyn Eilert, MD 09/03/13 2116 

## 2014-03-15 ENCOUNTER — Ambulatory Visit (INDEPENDENT_AMBULATORY_CARE_PROVIDER_SITE_OTHER): Payer: 59 | Admitting: Emergency Medicine

## 2014-03-15 VITALS — BP 118/80 | HR 92 | Temp 98.2°F | Resp 16 | Ht 63.5 in | Wt 161.6 lb

## 2014-03-15 DIAGNOSIS — S8011XA Contusion of right lower leg, initial encounter: Secondary | ICD-10-CM

## 2014-03-15 NOTE — Patient Instructions (Signed)
Contusion °A contusion is a deep bruise. Contusions are the result of an injury that caused bleeding under the skin. The contusion may turn blue, purple, or yellow. Minor injuries will give you a painless contusion, but more severe contusions may stay painful and swollen for a few weeks.  °CAUSES  °A contusion is usually caused by a blow, trauma, or direct force to an area of the body. °SYMPTOMS  °· Swelling and redness of the injured area. °· Bruising of the injured area. °· Tenderness and soreness of the injured area. °· Pain. °DIAGNOSIS  °The diagnosis can be made by taking a history and physical exam. An X-ray, CT scan, or MRI may be needed to determine if there were any associated injuries, such as fractures. °TREATMENT  °Specific treatment will depend on what area of the body was injured. In general, the best treatment for a contusion is resting, icing, elevating, and applying cold compresses to the injured area. Over-the-counter medicines may also be recommended for pain control. Ask your caregiver what the best treatment is for your contusion. °HOME CARE INSTRUCTIONS  °· Put ice on the injured area. °¨ Put ice in a plastic bag. °¨ Place a towel between your skin and the bag. °¨ Leave the ice on for 15-20 minutes, 3-4 times a day, or as directed by your health care provider. °· Only take over-the-counter or prescription medicines for pain, discomfort, or fever as directed by your caregiver. Your caregiver may recommend avoiding anti-inflammatory medicines (aspirin, ibuprofen, and naproxen) for 48 hours because these medicines may increase bruising. °· Rest the injured area. °· If possible, elevate the injured area to reduce swelling. °SEEK IMMEDIATE MEDICAL CARE IF:  °· You have increased bruising or swelling. °· You have pain that is getting worse. °· Your swelling or pain is not relieved with medicines. °MAKE SURE YOU:  °· Understand these instructions. °· Will watch your condition. °· Will get help right  away if you are not doing well or get worse. °Document Released: 02/07/2005 Document Revised: 05/05/2013 Document Reviewed: 03/05/2011 °ExitCare® Patient Information ©2015 Exit[employer], LLC. This information is not intended to replace advice given to you by your health care provider. Make sure you discuss any questions you have with your health care provider. ° °

## 2014-03-15 NOTE — Progress Notes (Signed)
[facility] [address], [place] [address] [phone]  Date:  03/15/2014   Name:  Wendy Robertson   DOB:  March 28, 1981   MRN:  [id number]  PCP:  Geraldo PitterBLAND,VEITA J, MD    Chief Complaint: Knee Injury   History of Present Illness:  Wendy Robertson is a 33 y.o. very pleasant female patient who presents with the following:  Injured Saturday when she bumped her knee on a desktop computer box. Has pain and some bruising above the knee Able to walk and bear weight with no difficulty. No improvement with over the counter medications or other home remedies.  Denies other complaint or health concern today.   Patient Active Problem List   Diagnosis Date Noted  . Bilateral calf pain 11/05/2012  . Varicose veins of lower extremities with other complications 11/05/2012  . Ankle sprain 10/17/2011  . Tear of soleus muscle 09/19/2011    Past Medical History  Diagnosis Date  . Migraines     Past Surgical History  Procedure Laterality Date  . Leep    . Tubal ligation      History  Substance Use Topics  . Smoking status: Never Smoker   . Smokeless tobacco: Never Used  . Alcohol Use: No    Family History  Problem Relation Age of Onset  . Diabetes Mother   . Stroke Mother     No Known Allergies  Medication list has been reviewed and updated.  Current Outpatient Prescriptions on File Prior to Visit  Medication Sig Dispense Refill  . hydrOXYzine (ATARAX/VISTARIL) 25 MG tablet Take 1 tablet (25 mg total) by mouth every 6 (six) hours as needed for anxiety. 12 tablet 0   No current facility-administered medications on file prior to visit.    Review of Systems:  As per HPI, otherwise negative.    Physical Examination: Filed Vitals:   03/15/14 1132  BP: 118/80  Pulse: 92  Temp: 98.2 F (36.8 C)  Resp: 16   Filed Vitals:   03/15/14 1132  Height: 5' 3.5" (1.613 m)  Weight: 161 lb 9.6 oz (73.301 kg)   Body mass index is 28.17 kg/(m^2). Ideal  Body Weight: Weight in (lb) to have BMI = 25: 143.1   GEN: WDWN, NAD, Non-toxic, Alert & Oriented x 3 HEENT: Atraumatic, Normocephalic.  Ears and Nose: No external deformity. EXTR: No clubbing/cyanosis/edema NEURO: Normal gait.  PSYCH: Normally interactive. Conversant. Not depressed or anxious appearing.  Calm demeanor.  Right leg:  Tender and ecchymotic superior to  Patella.  Full ROM knee  Joint stable  Assessment and Plan: Contusion thigh Motrin RICE  Signed,  Phillips OdorJeffery Lillard Bailon, MD

## 2014-04-04 ENCOUNTER — Emergency Department (INDEPENDENT_AMBULATORY_CARE_PROVIDER_SITE_OTHER): Payer: 59

## 2014-04-04 ENCOUNTER — Emergency Department (HOSPITAL_COMMUNITY)
Admission: EM | Admit: 2014-04-04 | Discharge: 2014-04-04 | Disposition: A | Discharge status: Home / Self Care | Payer: 59 | Source: Home / Self Care | Attending: Family Medicine | Admitting: Family Medicine

## 2014-04-04 ENCOUNTER — Encounter (HOSPITAL_COMMUNITY): Payer: Self-pay | Admitting: Emergency Medicine

## 2014-04-04 DIAGNOSIS — T1490XA Injury, unspecified, initial encounter: Secondary | ICD-10-CM

## 2014-04-04 DIAGNOSIS — T1490 Injury, unspecified: Secondary | ICD-10-CM

## 2014-04-04 DIAGNOSIS — S8001XD Contusion of right knee, subsequent encounter: Secondary | ICD-10-CM

## 2014-04-04 NOTE — ED Notes (Signed)
Pt states that she injured her knee 03/13/2014. Pt states that it has still been hurting she got checked out was told possible contusion. But states she didn't have time for a xray 03/13/2014 because she was short on time.

## 2014-04-04 NOTE — Discharge Instructions (Signed)
Xrays were normal. I would advise switching to ibuprofen as directed on packaging for pain. May continue using ice. If symptoms persist, please discuss with your sports medicine provider at your upcoming appointment.  Contusion A contusion is a deep bruise. Contusions are the result of an injury that caused bleeding under the skin. The contusion may turn blue, purple, or yellow. Minor injuries will give you a painless contusion, but more severe contusions may stay painful and swollen for a few weeks.  CAUSES  A contusion is usually caused by a blow, trauma, or direct force to an area of the body. SYMPTOMS   Swelling and redness of the injured area.  Bruising of the injured area.  Tenderness and soreness of the injured area.  Pain. DIAGNOSIS  The diagnosis can be made by taking a history and physical exam. An X-ray, CT scan, or MRI may be needed to determine if there were any associated injuries, such as fractures. TREATMENT  Specific treatment will depend on what area of the body was injured. In general, the best treatment for a contusion is resting, icing, elevating, and applying cold compresses to the injured area. Over-the-counter medicines may also be recommended for pain control. Ask your caregiver what the best treatment is for your contusion. HOME CARE INSTRUCTIONS   Put ice on the injured area.  Put ice in a plastic bag.  Place a towel between your skin and the bag.  Leave the ice on for 15-20 minutes, 3-4 times a day, or as directed by your health care provider.  Only take over-the-counter or prescription medicines for pain, discomfort, or fever as directed by your caregiver. Your caregiver may recommend avoiding anti-inflammatory medicines (aspirin, ibuprofen, and naproxen) for 48 hours because these medicines may increase bruising.  Rest the injured area.  If possible, elevate the injured area to reduce swelling. SEEK IMMEDIATE MEDICAL CARE IF:   You have increased  bruising or swelling.  You have pain that is getting worse.  Your swelling or pain is not relieved with medicines. MAKE SURE YOU:   Understand these instructions.  Will watch your condition.  Will get help right away if you are not doing well or get worse. Document Released: 02/07/2005 Document Revised: 05/05/2013 Document Reviewed: 03/05/2011 [facility] Patient Information 2015 [employer], [place]. This information is not intended to replace advice given to you by your health care provider. Make sure you discuss any questions you have with your health care provider.

## 2014-04-04 NOTE — ED Provider Notes (Signed)
CSN: [id number]     Arrival date & time 04/04/14  1424 History   First MD Initiated Contact with Patient 04/04/14 1430     Chief Complaint  Patient presents with  . Knee Injury  . Knee Pain   (Consider location/radiation/quality/duration/timing/severity/associated sxs/prior Treatment) HPI Comments: States she was seen for same at local urgent care near the time of injury, but could not stay to have radiographs. States she has continued to have pain with ambulation since injury. Using occasional dose of tylenol at home for pain.  [facility], but appointment is not for another month.  No previous injury or surgery Non-smoker Works at [employer].   Patient is a 33 y.o. female presenting with knee pain. The history is provided by the patient.  Knee Pain Location:  Knee Time since incident: Patient states she struck her right knee on the corner of her husband's computer about one month ago.  Injury: yes   Knee location:  R knee   Past Medical History  Diagnosis Date  . Migraines    Past Surgical History  Procedure Laterality Date  . Leep    . Tubal ligation     Family History  Problem Relation Age of Onset  . Diabetes Mother   . Stroke Mother    History  Substance Use Topics  . Smoking status: Never Smoker   . Smokeless tobacco: Never Used  . Alcohol Use: No   OB History    No data available     Review of Systems  All other systems reviewed and are negative.   Allergies  Review of patient's allergies indicates no known allergies.  Home Medications   Prior to Admission medications   Medication Sig Start Date End Date Taking? Authorizing Provider  hydrOXYzine (ATARAX/VISTARIL) 25 MG tablet Take 1 tablet (25 mg total) by mouth every 6 (six) hours as needed for anxiety. 09/03/13   Gilda Creasehristopher J. Pollina, MD   BP 133/83 mmHg  Pulse 85  Temp(Src) 98.3 F (36.8 C) (Oral)  Resp 16  SpO2 100%   LMP 03/08/2014 Physical Exam  Constitutional: She is oriented to person, place, and time. She appears well-developed and well-nourished. No distress.  HENT:  Head: Normocephalic and atraumatic.  Eyes: Conjunctivae are normal.  Cardiovascular: Normal rate.   Pulmonary/Chest: Effort normal.  Musculoskeletal: Normal range of motion.       Right knee: She exhibits normal range of motion, no swelling, no effusion, no ecchymosis, no deformity and normal patellar mobility. Tenderness found. Patellar tendon tenderness noted.       Legs: Neurological: She is alert and oriented to person, place, and time.  Skin: Skin is warm and dry. No rash noted. No erythema.  Psychiatric: She has a normal mood and affect. Her behavior is normal.  Nursing note and vitals reviewed.   ED Course  Procedures (including critical care time) Labs Review Labs Reviewed - No data to display  Imaging Review Dg Knee Complete 4 Views Right  04/04/2014   CLINICAL DATA:  Struck anterior RIGHT knee on 03/13/2014, continued anterior knee pain, walking with a limp  EXAM: RIGHT KNEE - COMPLETE 4+ VIEW  COMPARISON:  None  FINDINGS: Osseous mineralization probably normal for technique.  Joint spaces preserved.  No acute fracture, dislocation or bone destruction.  Small bone island at lateral femoral condyle.  No knee joint effusion or focal patellar abnormalities identified.  IMPRESSION: Normal exam.  Electronically Signed   By: Ulyses SouthwardMark  Boles M.D.   On: 04/04/2014 15:00     MDM   1. Knee contusion, right, subsequent encounter   2. Injury   3. Injury   Xrays were normal. I would advise switching to ibuprofen as directed on packaging for pain. May continue using ice. If symptoms persist, please discuss with your sports medicine provider at your upcoming appointment.   Ria ClockJennifer Haren H Javonta Gronau, [place] 04/04/14 [phone]

## 2014-04-23 ENCOUNTER — Encounter: Payer: Self-pay | Admitting: Sports Medicine

## 2014-04-23 ENCOUNTER — Ambulatory Visit (INDEPENDENT_AMBULATORY_CARE_PROVIDER_SITE_OTHER): Payer: 59 | Admitting: Sports Medicine

## 2014-04-23 VITALS — BP 125/84 | HR 103 | Ht 62.0 in | Wt 162.0 lb

## 2014-04-23 DIAGNOSIS — M25561 Pain in right knee: Secondary | ICD-10-CM

## 2014-04-23 NOTE — Assessment & Plan Note (Signed)
See visit note for plan

## 2014-04-23 NOTE — Progress Notes (Signed)
   Subjective:    Patient ID: Wendy Robertson, female    DOB: Apr 04, 1981, 33 y.o.   MRN: [id number]  HPI  Right knee pain: Patient states sometime around Halloween, she got into bed and hit her bent knee on the corner of her husband's computer. She states she had immediate pain, and yellowish bruising without swelling. She was able to walk on the knee within 30 minutes. She's had no prior trauma to the knee. She has tried ice and ibuprofen for the last few weeks without great result. She states currently it isn't red, warm or swollen, but she is feeling a popping when she walks which produces a sharp pain 10/10. The pain will last for approximately 15 minutes sometimes an hour. Occurs 3-4 times a day if she has an active day.  Left calf pain: In June 2013 patient suffered from a large soleus tear her left calf after completing and insanity workout at home. She was tried on some therapeutic exercises compression sleeve, which she used intermittently. She complains today of still some discomfort in her soleus with activity. She also was noted to have varicose veins in the left lower extremity that was felt that could be potentially causing some of her discomfort in June 2014.   Never smoker  Past Medical History  Diagnosis Date  . Migraines    No Known Allergies  Review of Systems Per HPI    Objective:   Physical Exam BP 125/84 mmHg  Pulse 103  Ht 5\' 2"  (1.575 m)  Wt 162 lb (73.483 kg)  BMI 29.62 kg/m2 Gen: A pleasant, Caucasian female, no acute distress, nontoxic appearance, well-developed, well-nourished, mildly overweight. Alert, oriented 3 Right Knee: No erythema, no edema, no soft tissue swelling. Small 1 cm scar at location of pain right, lateral upper pole of patella. No effusions. Normal range of motion. Intermittent popping with full extension. Knee grinding present. Muscle strength 5/5 abduction and adduction, flexion and extension. Negative McMurray's. Negative Lachman's.  Negative ligament laxity. Neurovascularly intact distally. Left calf: No erythema, no edema, no soft tissue swelling. Varicosities is noted on calf. Full range of motion. Strength 5 out of 5 in all planes. Neurovascular intact distally  US right knee: Patellar tendon intact, quad tendon intact. No patellar pouch effusion. Normal meniscus medial and lateral. Small patellofemoral groove irregularity in cortex with hyperechoic sliver suggestive of cartilage tear in the upper lateral PFG.     Assessment & Plan:  Wendy Robertson is a 33 y.o. female with small cartilage irregularity in the upper lateral PFG. In addition patient is still having soleus discomfort from the large soleus tear in June of this year. - Patient is to perform abduction, abduction, hip and leg raise exercises. - Wear her compression sleeve with activity daily. - Ice for 20 minutes in duration when needed. - Left calf exercises for comfort and soleus, wear compression sleeve. If doesn't improve with these exercises and see her leave application, pain may be coming from varicose veins located in her leg. Discussed this in detail with her today, ultimately a vascular referral may be needed to dress her varicosities. - Low up in 6 weeks

## 2014-04-23 NOTE — Patient Instructions (Signed)
Complete exercise on both knee and calf as described today. Wear your compression sleeves. Ice as desired 20 minutes at a time.  F/u 6 weeks

## 2014-04-28 ENCOUNTER — Ambulatory Visit: Payer: 59 | Admitting: Sports Medicine

## 2014-06-15 ENCOUNTER — Ambulatory Visit: Payer: 59 | Admitting: Sports Medicine

## 2014-07-13 ENCOUNTER — Ambulatory Visit: Payer: 59 | Admitting: Sports Medicine

## 2015-06-02 DIAGNOSIS — G43519 Persistent migraine aura without cerebral infarction, intractable, without status migrainosus: Secondary | ICD-10-CM | POA: Diagnosis not present

## 2015-06-03 MED FILL — PROPRANOLOL 80 MG TABLET: 80 | 30 days supply | Qty: 60 | Fill #0

## 2015-07-01 ENCOUNTER — Ambulatory Visit (INDEPENDENT_AMBULATORY_CARE_PROVIDER_SITE_OTHER): Payer: 59 | Admitting: Physician Assistant

## 2015-07-01 VITALS — BP 110/80 | HR 80 | Temp 98.8°F | Resp 16 | Ht 63.0 in | Wt 153.0 lb

## 2015-07-01 DIAGNOSIS — M79601 Pain in right arm: Secondary | ICD-10-CM

## 2015-07-01 NOTE — Progress Notes (Signed)
[facility] [address] [place] [address] [phone]  Date:  07/01/2015   Name:  Wendy Robertson   DOB:  July 25, 1980   MRN:  [id number]  PCP:  Geraldo Pitter, MD    Chief Complaint: Arm Injury   History of Present Illness:  This is a 35 y.o. female who is presenting with a right arm injury that occurred this morning. States she was stretching her arms behind her head. When she brought her arms back down by her body, she felt a pop in her right forearm. There is no bruising, swelling or other skin changes. She is noticing pain when she tries to use her right hand. She is feeling most of the pain under her thumb. She denies paresthesias. She has not tried anything for pain. 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  Review of Systems:  Review of Systems See HPI  Patient Active Problem List   Diagnosis Date Noted  . Right knee pain 04/23/2014  . Bilateral calf pain 11/05/2012  . Varicose veins of lower extremities with other complications 11/05/2012  . Ankle sprain 10/17/2011  . Tear of soleus muscle 09/19/2011    Prior to Admission medications   Medication Sig Start Date End Date Taking? Authorizing Provider  butalbital-acetaminophen-caffeine (FIORICET WITH CODEINE) 50-325-40-30 MG capsule Take 1 capsule by mouth every 4 (four) hours as needed for headache.   Yes Historical Provider, MD  propranolol (INDERAL) 40 MG tablet Take 40 mg by mouth 3 (three) times daily.   Yes Historical Provider, MD    No Known Allergies  Past Surgical History  Procedure Laterality Date  . Leep    . Tubal ligation      Social History  Substance Use Topics  . Smoking status: Never Smoker   . Smokeless tobacco: Never Used  . Alcohol Use: No    Family History  Problem Relation Age of Onset  . Diabetes Mother   . Stroke Mother     Medication list has been reviewed and updated.  Physical Examination:  Physical Exam  Constitutional: She is oriented to person,  place, and time. She appears well-developed and well-nourished. No distress.  HENT:  Head: Normocephalic and atraumatic.  Right Ear: Hearing normal.  Left Ear: Hearing normal.  Nose: Nose normal.  Eyes: Conjunctivae and lids are normal. Right eye exhibits no discharge. Left eye exhibits no discharge. No scleral icterus.  Cardiovascular: Normal rate and regular rhythm.   Pulmonary/Chest: Effort normal. No respiratory distress.  Musculoskeletal: Normal range of motion.       Right shoulder: Normal.       Left shoulder: Normal.       Right elbow: She exhibits normal range of motion and no swelling.       Left elbow: Normal.       Right wrist: Normal. She exhibits normal range of motion, no tenderness, no bony tenderness and no swelling.       Left wrist: Normal.       Right upper arm: She exhibits tenderness (mild tricep).  Mild lateral elbow tenderness No scaphoid tenderness Mild pain over thenar eminence Decreased strength with opposition in right hand. Full strength with hand grasp   Neurological: She is alert and oriented to person, place, and time. No sensory deficit.  Skin: Skin is warm, dry and intact. No lesion and no rash noted.  Psychiatric: She has a normal mood and affect. Her speech is normal and behavior is normal.  Thought content normal.   BP 110/80 mmHg  Pulse 80  Temp(Src) 98.8 F (37.1 C) (Oral)  Resp 16  Ht  (1.6 m)  Wt 153 lb (69.4 kg)  BMI 27.11 kg/m2  SpO2 98%  LMP 06/21/2015 (Approximate)  Assessment and Plan:  1. Arm pain, right Suspect muscle strain. No skin changes. General mild pain throughout right upper arm, forearm and hand. Do not think radiographs would be beneficial. Advised to rest, apply heat, take ibuprofen. Do not lift more than 5-10 pounds. Return in 1 week if symptoms do not improve or at any time if symptoms worsen.    [place]well Min[place]ke Brackett, MHS [facility]  07/01/2015

## 2015-07-01 NOTE — Patient Instructions (Signed)
Take it easy with lifting at work. Take ibuprofen 400-600 mg three times a day. May also take tylenol if needed. Apply a heating pad to your arm. Return in 1 week if symptoms are not improving or at any time if symptoms worsen.

## 2015-07-05 DIAGNOSIS — G43119 Migraine with aura, intractable, without status migrainosus: Secondary | ICD-10-CM | POA: Diagnosis not present

## 2015-07-06 MED FILL — PROPRANOLOL 60 MG TABLET: 60 | 30 days supply | Qty: 120 | Fill #0

## 2015-07-14 MED FILL — BUTALBITAL/APAP/CAFFEINE TB: 50-325-40 | 5 days supply | Qty: 30 | Fill #1

## 2015-08-04 IMAGING — CR DG CHEST 2V
2 series · 2 of 2 positions shown · non-contrast
Comparison: 09/01/2012

CLINICAL DATA: Shortness of breath.

EXAM:
CHEST  2 VIEW

[view not recorded (1 of 2)]
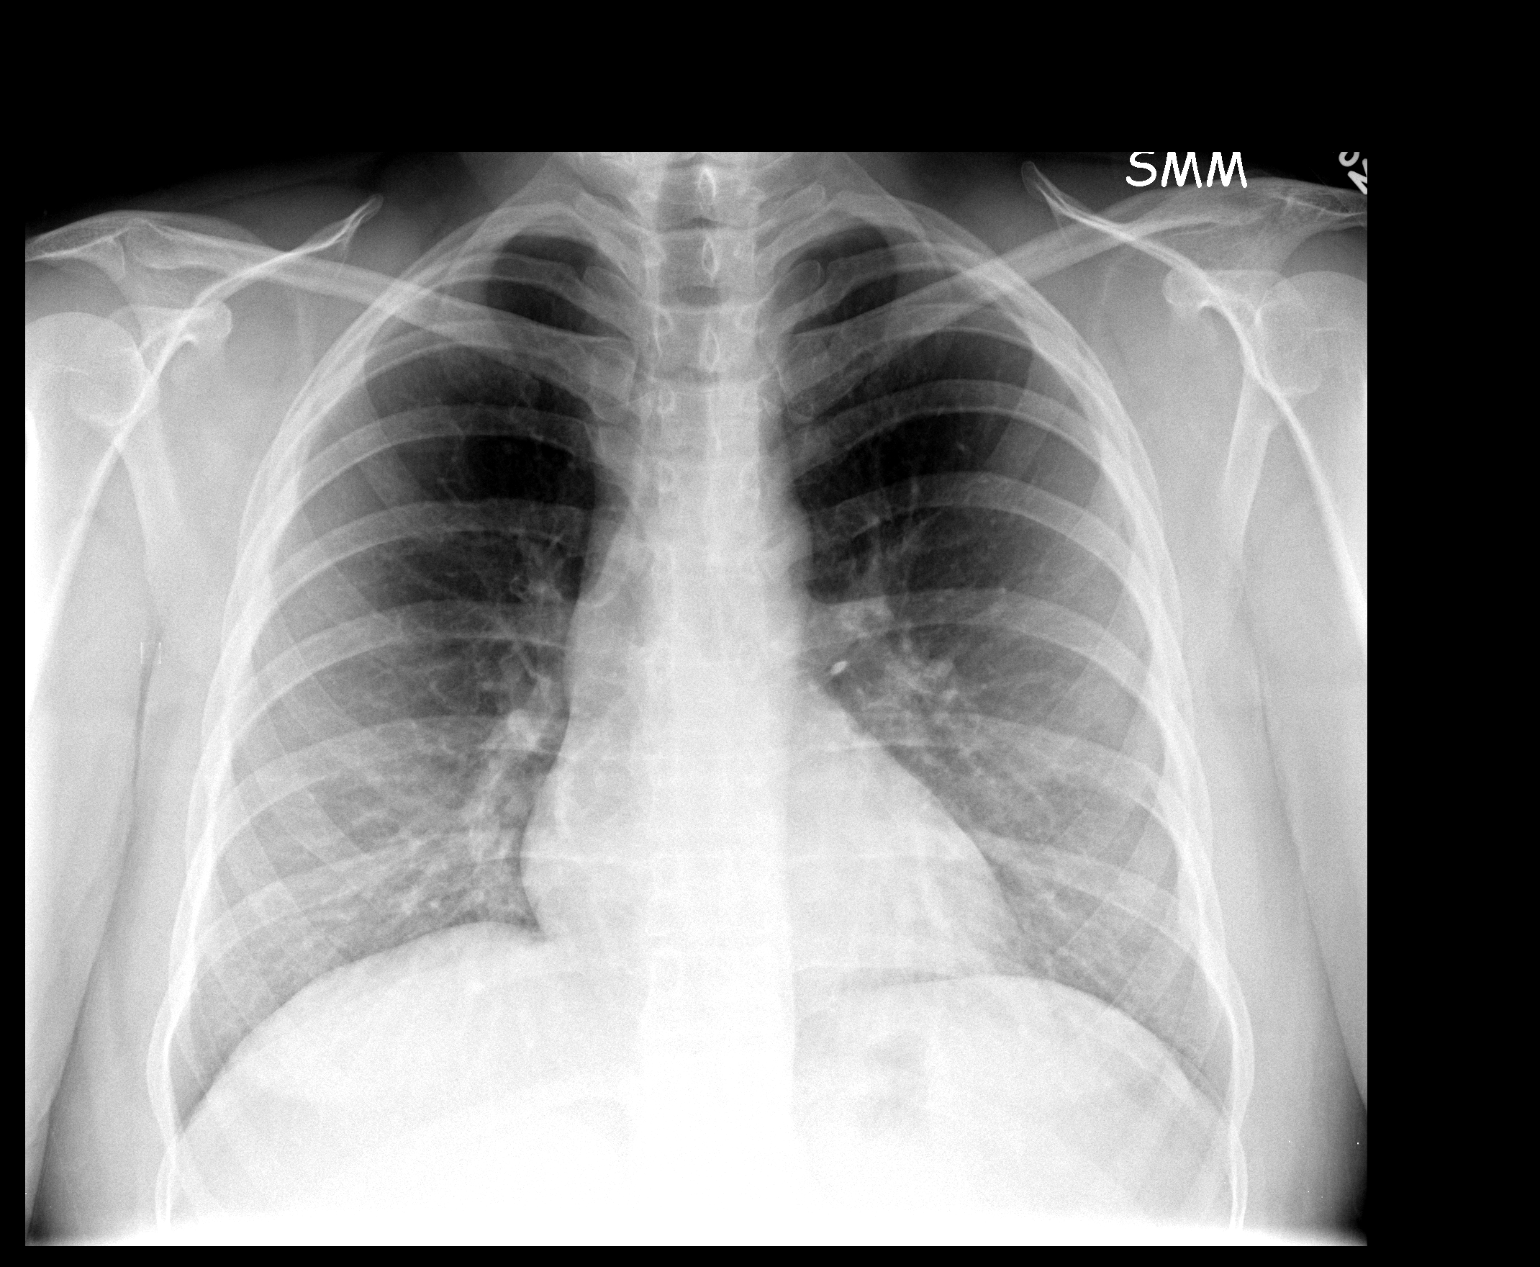

[view not recorded (2 of 2)]
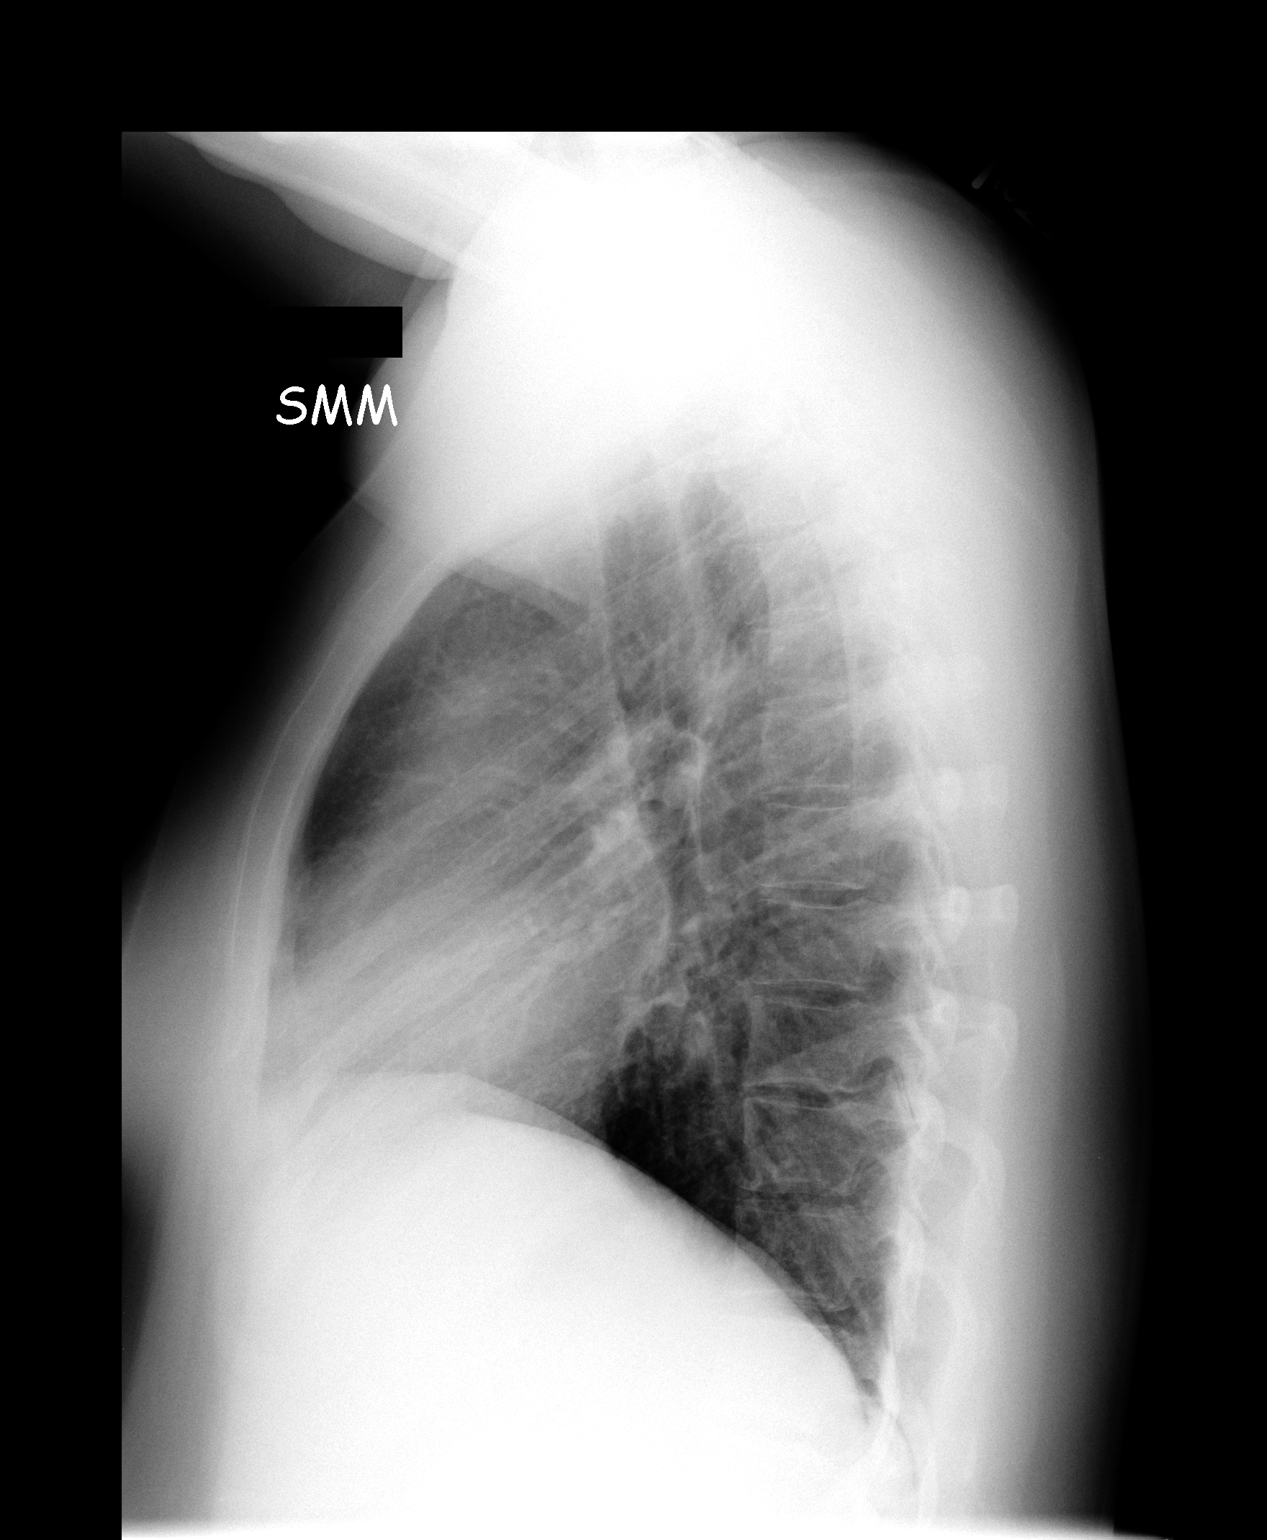

[2 of 2 positions shown; findings below may reference images not displayed]

FINDINGS: The heart size and mediastinal contours are within normal limits.
Both lungs are clear. The visualized skeletal structures are
unremarkable.
IMPRESSION: No active cardiopulmonary disease.

## 2015-08-08 DIAGNOSIS — H52223 Regular astigmatism, bilateral: Secondary | ICD-10-CM | POA: Diagnosis not present

## 2015-09-15 DIAGNOSIS — G43119 Migraine with aura, intractable, without status migrainosus: Secondary | ICD-10-CM | POA: Diagnosis not present

## 2015-09-15 DIAGNOSIS — J0110 Acute frontal sinusitis, unspecified: Secondary | ICD-10-CM | POA: Diagnosis not present

## 2015-09-15 MED FILL — AMOX TR-K CLV 875-125 MG TA: 875-125 | 7 days supply | Qty: 14 | Fill #0

## 2015-09-15 MED FILL — PROPRANOLOL 60 MG TABLET: 60 | 30 days supply | Qty: 120 | Fill #0

## 2015-09-19 MED FILL — BUTALB-ACETAMIN-CAFF 50-325: 50-325-40 | 7 days supply | Qty: 39 | Fill #0

## 2015-10-17 ENCOUNTER — Ambulatory Visit: Payer: 59 | Admitting: Neurology

## 2015-12-15 DIAGNOSIS — Z0000 Encounter for general adult medical examination without abnormal findings: Secondary | ICD-10-CM | POA: Diagnosis not present

## 2015-12-20 DIAGNOSIS — Z0000 Encounter for general adult medical examination without abnormal findings: Secondary | ICD-10-CM | POA: Diagnosis not present

## 2015-12-20 DIAGNOSIS — Z23 Encounter for immunization: Secondary | ICD-10-CM | POA: Diagnosis not present

## 2015-12-20 DIAGNOSIS — G43019 Migraine without aura, intractable, without status migrainosus: Secondary | ICD-10-CM | POA: Diagnosis not present

## 2016-01-18 ENCOUNTER — Emergency Department (HOSPITAL_COMMUNITY)
Admission: EM | Admit: 2016-01-18 | Discharge: 2016-01-18 | Disposition: A | Discharge status: Home / Self Care | Payer: 59 | Attending: Emergency Medicine | Admitting: Emergency Medicine

## 2016-01-18 ENCOUNTER — Encounter (HOSPITAL_COMMUNITY): Payer: Self-pay | Admitting: *Deleted

## 2016-01-18 DIAGNOSIS — K0381 Cracked tooth: Secondary | ICD-10-CM | POA: Diagnosis not present

## 2016-01-18 DIAGNOSIS — S025XXA Fracture of tooth (traumatic), initial encounter for closed fracture: Secondary | ICD-10-CM | POA: Diagnosis not present

## 2016-01-18 DIAGNOSIS — K047 Periapical abscess without sinus: Secondary | ICD-10-CM

## 2016-01-18 DIAGNOSIS — K0889 Other specified disorders of teeth and supporting structures: Secondary | ICD-10-CM | POA: Diagnosis present

## 2016-01-18 MED ORDER — PENICILLIN V POTASSIUM 500 MG PO TABS: 500.0000 mg | ORAL_TABLET | Freq: Four times a day (QID) | ORAL | 0 refills | Status: DC

## 2016-01-18 MED ORDER — TRAMADOL HCL 50 MG PO TABS
50.0000 mg | ORAL_TABLET | Freq: Once | ORAL | Status: AC
  Administered 2016-01-18: 50 mg via ORAL
  Filled 2016-01-18: qty 1

## 2016-01-18 MED ORDER — PENICILLIN V POTASSIUM 250 MG PO TABS
500.0000 mg | ORAL_TABLET | Freq: Once | ORAL | Status: AC
  Administered 2016-01-18: 500 mg via ORAL
  Filled 2016-01-18: qty 2

## 2016-01-18 MED ORDER — NAPROXEN 500 MG PO TABS: 500.0000 mg | ORAL_TABLET | Freq: Two times a day (BID) | ORAL | 0 refills | Status: DC

## 2016-01-18 MED ORDER — PENICILLIN V POTASSIUM 500 MG PO TABS: 500.0000 mg | ORAL_TABLET | Freq: Four times a day (QID) | ORAL | 0 refills | Status: AC

## 2016-01-18 NOTE — ED Provider Notes (Signed)
[facility]EM[facility]GENCY DEPT Provider Note   CSN: [id number] Arrival date & time: 01/18/16  2220  By signing my name below, I, Modena Jansky, attest that this documentation has been prepared under the direction and in the presence of non-physician practitioner, Dub Mikes, PA-C. Electronically Signed: Modena Jansky, Scribe. 01/18/2016. 10:47 PM.  History   Chief Complaint Chief Complaint  Patient presents with  . Dental Pain   The history is provided by the patient. No language interpreter was used.   HPI Comments: Wendy Robertson is a 35 y.o. female wit no pertinent PMHx who presents to the Emergency Department complaining of left-sided dental pain that started yesterday. She states she needs to have 4 back lower teeth removed on each side, and the "root" of one tooth is protruding. This has been present for many years but the pain began yesterday. She describes the pain as throbbing and radiating to her head, with no aggravating factors. Denies fever or chills.   Past Medical History:  Diagnosis Date  . Migraines     Patient Active Problem List   Diagnosis Date Noted  . Right knee pain 04/23/2014  . Bilateral calf pain 11/05/2012  . Varicose veins of lower extremities with other complications 11/05/2012  . Ankle sprain 10/17/2011  . Tear of soleus muscle 09/19/2011    Past Surgical History:  Procedure Laterality Date  . LEEP    . TUBAL LIGATION      OB History    No data available       Home Medications    Prior to Admission medications   Medication Sig Start Date End Date Taking? Authorizing Provider  butalbital-acetaminophen-caffeine (FIORICET WITH CODEINE) 50-325-40-30 MG capsule Take 1 capsule by mouth every 4 (four) hours as needed for headache.    Historical Provider, MD  naproxen (NAPROSYN) 500 MG tablet Take 1 tablet (500 mg total) by mouth 2 (two) times daily. 01/18/16   Reeta Kuk Tripp Diogenes Whirley, PA-C  penicillin v potassium (VEETID) 500 MG tablet Take 1 tablet  (500 mg total) by mouth 4 (four) times daily. 01/18/16 01/25/16  Renel Ende Tripp Rikia Sukhu, PA-C  propranolol (INDERAL) 40 MG tablet Take 40 mg by mouth 3 (three) times daily.    Historical Provider, MD    Family History Family History  Problem Relation Age of Onset  . Diabetes Mother   . Stroke Mother     Social History Social History  Substance Use Topics  . Smoking status: Never Smoker  . Smokeless tobacco: Never Used  . Alcohol use No     Allergies   Review of patient's allergies indicates no known allergies.   Review of Systems Review of Systems 10 Systems reviewed and all are negative for acute change except as noted in the HPI.  Physical Exam Updated Vital Signs BP 132/90 (BP Location: Left Arm)   Pulse 79   Temp 97.7 F (36.5 C) (Oral)   Resp 18   LMP 12/18/2015   SpO2 100%   Physical Exam  Constitutional: She is oriented to person, place, and time. She appears well-developed and well-nourished. No distress.  HENT:  Head: Normocephalic and atraumatic.  Mouth/Throat: Oropharynx is clear and moist and mucous membranes are normal. No trismus in the jaw. Abnormal dentition. Dental caries present. No dental abscesses. No tonsillar exudate.    No swelling under tongue  Eyes: Conjunctivae are normal. Right eye exhibits no discharge. Left eye exhibits no discharge. No scleral icterus.  Neck: Normal range of motion. Neck supple.  Cardiovascular: Normal rate.   Pulmonary/Chest: Effort normal.  Neurological: She is alert and oriented to person, place, and time. Coordination normal.  Skin: Skin is warm and dry. No rash noted. She is not diaphoretic. No erythema. No pallor.  Psychiatric: She has a normal mood and affect. Her behavior is normal.  Nursing note and vitals reviewed.    ED Treatments / Results  DIAGNOSTIC STUDIES: Oxygen Saturation is 100% on RA, normal by my interpretation.    COORDINATION OF CARE: 10:51 PM- Pt advised of plan for treatment and pt  agrees.  Labs (all labs ordered are listed, but only abnormal results are displayed) Labs Reviewed - No data to display  EKG  EKG Interpretation None       Radiology No results found.  Procedures Procedures (including critical care time)  Medications Ordered in ED Medications  penicillin v potassium (VEETID) tablet 500 mg (500 mg Oral Given 01/18/16 2257)  traMADol (ULTRAM) tablet 50 mg (50 mg Oral Given 01/18/16 2257)     Initial Impression / Assessment and Plan / ED Course  I have reviewed the triage vital signs and the nursing notes.  Pertinent labs & imaging results that were available during my care of the patient were reviewed by me and considered in my medical decision making (see chart for details).  Clinical Course   Patient with toothache w/ avulsion and surrounding gingival erythema. Multiple other dental caries throughout mouth. No gross abscess.  Exam unconcerning for Ludwig's angina or spread of infection.  Will treat with penicillin and pain medicine.  Urged patient to follow-up with dentist.  Referral given. Final Clinical Impressions(s) / ED Diagnoses   Final diagnoses:  Dental infection  Broken tooth, closed, initial encounter    New Prescriptions New Prescriptions   NAPROXEN (NAPROSYN) 500 MG TABLET    Take 1 tablet (500 mg total) by mouth 2 (two) times daily.   PENICILLIN V POTASSIUM (VEETID) 500 MG TABLET    Take 1 tablet (500 mg total) by mouth 4 (four) times daily.   I personally performed the services described in this documentation, which was scribed in my presence. The recorded information has been reviewed and is accurate.      Lester KinsmanSamantha Tripp RaymondDowless, PA-C 01/19/16 0004    Shaune Pollackameron Isaacs, MD 01/20/16 1159

## 2016-01-18 NOTE — Discharge Instructions (Signed)
Take antibiotics as prescribed. Take naproxen as needed for pain. Follow-up with a dentist as soon as possible for consultation and likely tooth extraction. Please contact the above listed dentist in the morning and let them know that you were referred to see them from the emergency department. Return to the ED if you experience significant worsening or symptoms, increased facial swelling or swelling under tongue, fevers, chills, difficulty breathing/swallowing or opening her mouth.

## 2016-01-18 NOTE — ED Triage Notes (Signed)
Pt c/o a toothache since yesterday  lmp last month

## 2016-01-18 NOTE — ED Notes (Signed)
Patient able to ambulate independently  

## 2016-01-19 MED FILL — PENICILLIN VK 500 MG TABLET: 500 | 10 days supply | Qty: 40 | Fill #0

## 2016-01-19 MED FILL — NAPROXEN 500 MG TABLET: 500 | 15 days supply | Qty: 30 | Fill #0

## 2016-12-04 DIAGNOSIS — G43119 Migraine with aura, intractable, without status migrainosus: Secondary | ICD-10-CM | POA: Diagnosis not present

## 2016-12-06 MED FILL — PROPRANOLOL 60 MG TABLET: 60 | 30 days supply | Qty: 120 | Fill #0

## 2017-03-20 DIAGNOSIS — G43109 Migraine with aura, not intractable, without status migrainosus: Secondary | ICD-10-CM | POA: Diagnosis not present

## 2017-03-20 MED FILL — PROMETHAZINE 12.5 MG TABLET: 12.5 | 13 days supply | Qty: 40 | Fill #0

## 2017-03-20 MED FILL — RIZATRIPTAN 5 MG TABLET: 5 | 30 days supply | Qty: 10 | Fill #0

## 2017-03-20 MED FILL — ZONISAMIDE 50 MG CAPSULE: 50 | 30 days supply | Qty: 120 | Fill #0

## 2017-03-30 DIAGNOSIS — H52223 Regular astigmatism, bilateral: Secondary | ICD-10-CM | POA: Diagnosis not present

## 2017-05-09 DIAGNOSIS — Z113 Encounter for screening for infections with a predominantly sexual mode of transmission: Secondary | ICD-10-CM | POA: Diagnosis not present

## 2017-05-09 DIAGNOSIS — Z124 Encounter for screening for malignant neoplasm of cervix: Secondary | ICD-10-CM | POA: Diagnosis not present

## 2017-05-09 DIAGNOSIS — Z6827 Body mass index (BMI) 27.0-27.9, adult: Secondary | ICD-10-CM | POA: Diagnosis not present

## 2017-05-09 DIAGNOSIS — R21 Rash and other nonspecific skin eruption: Secondary | ICD-10-CM | POA: Diagnosis not present

## 2017-05-09 DIAGNOSIS — Z01419 Encounter for gynecological examination (general) (routine) without abnormal findings: Secondary | ICD-10-CM | POA: Diagnosis not present

## 2017-05-09 MED FILL — CLOTRIMAZOLE-BETAMETHASONE: 1-0.05 | 14 days supply | Qty: 15 | Fill #0

## 2017-07-05 ENCOUNTER — Ambulatory Visit (INDEPENDENT_AMBULATORY_CARE_PROVIDER_SITE_OTHER): Payer: No Typology Code available for payment source | Admitting: Family Medicine

## 2017-07-05 ENCOUNTER — Encounter: Payer: Self-pay | Admitting: Family Medicine

## 2017-07-05 ENCOUNTER — Other Ambulatory Visit: Payer: Self-pay

## 2017-07-05 VITALS — BP 110/84 | HR 99 | Temp 100.0°F | Ht 64.37 in | Wt 143.2 lb

## 2017-07-05 DIAGNOSIS — G43109 Migraine with aura, not intractable, without status migrainosus: Secondary | ICD-10-CM | POA: Insufficient documentation

## 2017-07-05 MED ORDER — AMITRIPTYLINE HCL 25 MG PO TABS: 25.0000 mg | ORAL_TABLET | Freq: Every day | ORAL | 1 refills | Status: DC

## 2017-07-05 MED ORDER — PROCHLORPERAZINE MALEATE 10 MG PO TABS: 10.0000 mg | ORAL_TABLET | Freq: Every day | ORAL | 0 refills | Status: DC | PRN

## 2017-07-05 MED FILL — AMITRIPTYLINE HCL 25 MG TAB: 25 | 30 days supply | Qty: 60 | Fill #0

## 2017-07-05 MED FILL — PROCHLORPERAZINE 10 MG TAB: 10 | 30 days supply | Qty: 30 | Fill #0

## 2017-07-05 NOTE — Progress Notes (Signed)
2/22/201910:29 AM  Wendy MosherShirl A Macleod [birth year], 37 y.o. female [id number]  Chief Complaint  Patient presents with  . Migraine    has been having migraines since high school    HPI:   Patient is a 37 y.o. female with past medical history significant for migraines who presents today for management   Migraines started in high school, of recent they have become more intense and now also including nausea  She states she gets about 2 migraines a month, they last 2-3 days They always start with an aura, bilateral small sharp color flashes that expand in size Followed by usually bilateral temple headache, tingling of her fingertips and tongue numbness Normal head ct scan per patient , no mri Has neurology in the past, has had to change due to insurance, most recently saw neuro at [facility] on 03/2017 Started zonegran and maxalt at that visit  Has tried multiple medications and has stopped due to side effects: Propranolol too tired,  topamax tingiling of soles of feet, anxiety zonegran confusion and sharp right sided head pain imitrex - heart palpitaions maxalt - low dose made her too sleepy OTC meds (excedrin, APAP, NSAIDS) - do not provide relief  BTL for contraception  [facility] 07/05/2017 07/01/2015 04/23/2014  Decreased Interest 0 0 0  Down, Depressed, Hopeless 0 0 0  PHQ - 2 Score 0 0 0    No Known Allergies  Prior to Admission medications   Not on File    Past Medical History:  Diagnosis Date  . Migraines     Past Surgical History:  Procedure Laterality Date  . LEEP    . TUBAL LIGATION      Social History   Tobacco Use  . Smoking status: Never Smoker  . Smokeless tobacco: Never Used  Substance Use Topics  . Alcohol use: No    Family History  Problem Relation Age of Onset  . Diabetes Mother   . Stroke Mother     Review of Systems  Constitutional: Negative for chills and fever.  Respiratory: Negative for cough and shortness of breath.     Cardiovascular: Negative for chest pain, palpitations and leg swelling.  Gastrointestinal: Negative for abdominal pain, nausea and vomiting.   Per hpi  OBJECTIVE:  Blood pressure 110/84, pulse 99, temperature 100 F (37.8 C), temperature source Oral, height 5' 4.37" (1.635 m), weight 143 lb 3.2 oz (65 kg), SpO2 100 %.  Physical Exam  Constitutional: She is oriented to person, place, and time and well-developed, well-nourished, and in no distress.  HENT:  Head: Normocephalic and atraumatic.  Mouth/Throat: Oropharynx is clear and moist. No oropharyngeal exudate.  Eyes: EOM are normal. Pupils are equal, round, and reactive to light. No scleral icterus.  Neck: Neck supple.  Cardiovascular: Normal rate, regular rhythm and normal heart sounds. Exam reveals no gallop and no friction rub.  No murmur heard. Pulmonary/Chest: Effort normal and breath sounds normal. She has no wheezes. She has no rales.  Musculoskeletal: She exhibits no edema.  Neurological: She is alert and oriented to person, place, and time. No cranial nerve deficit. Gait normal. Coordination normal.  Skin: Skin is warm and dry.    ASSESSMENT and PLAN  1. Migraine with aura and without status migrainosus, not intractable - Ambulatory referral to Neurology Will do trial of TCA and antiemetic for prophylactic and abortive, families not tried, also referring to neurology for further eval and mgt, new meds r/se/b reviewed  Other orders - amitriptyline (  ELAVIL) 25 MG tablet; Take 1-2 tablets (25-50 mg total) by mouth at bedtime. - prochlorperazine (COMPAZINE) 10 MG tablet; Take 1 tablet (10 mg total) by mouth daily as needed. Take at onset of migraine  Return in about 6 weeks (around 08/16/2017) for after seeing neurology.    Myles Lipps, MD [facility] at [facility] [address] [place], [place] [address] Ph.  [phone] Fax [phone]

## 2017-07-05 NOTE — Patient Instructions (Addendum)
1. Start amitriptyline 25mg  at bedtime, consider increasing to 50mg  at bedtime if no improvement in frequency and/or intensity of migraines after 4 weeks.      IF you received an x-ray today, you will receive an invoice from [facility]. Please contact [facility] at [phone] with questions or concerns regarding your invoice.   IF you received labwork today, you will receive an invoice from [facility]. Please contact [facility] at [phone] with questions or concerns regarding your invoice.   Our billing staff will not be able to assist you with questions regarding bills from these companies.  You will be contacted with the lab results as soon as they are available. The fastest way to get your results is to activate your My Chart account. Instructions are located on the last page of this paperwork. If you have not heard from us regarding the results in 2 weeks, please contact this office.

## 2017-07-20 ENCOUNTER — Emergency Department (HOSPITAL_COMMUNITY): Payer: No Typology Code available for payment source

## 2017-07-20 ENCOUNTER — Encounter (HOSPITAL_COMMUNITY): Payer: Self-pay | Admitting: *Deleted

## 2017-07-20 ENCOUNTER — Emergency Department (HOSPITAL_COMMUNITY)
Admission: EM | Admit: 2017-07-20 | Discharge: 2017-07-20 | Disposition: A | Discharge status: Home / Self Care | Payer: No Typology Code available for payment source | Attending: Emergency Medicine | Admitting: Emergency Medicine

## 2017-07-20 DIAGNOSIS — N201 Calculus of ureter: Secondary | ICD-10-CM | POA: Diagnosis not present

## 2017-07-20 DIAGNOSIS — R109 Unspecified abdominal pain: Secondary | ICD-10-CM | POA: Diagnosis present

## 2017-07-20 DIAGNOSIS — N23 Unspecified renal colic: Secondary | ICD-10-CM

## 2017-07-20 LAB — CBC
HCT: 40.1 % (ref 36.0–46.0)
Hemoglobin: 13.5 g/dL (ref 12.0–15.0)
MCH: 30.5 pg (ref 26.0–34.0)
MCHC: 33.7 g/dL (ref 30.0–36.0)
MCV: 90.5 fL (ref 78.0–100.0)
PLATELETS: 400 10*3/uL (ref 150–400)
RBC: 4.43 MIL/uL (ref 3.87–5.11)
RDW: 13.6 % (ref 11.5–15.5)
WBC: 7 10*3/uL (ref 4.0–10.5)

## 2017-07-20 LAB — BASIC METABOLIC PANEL
Anion gap: 16 — ABNORMAL HIGH (ref 5–15)
BUN: 14 mg/dL (ref 6–20)
CALCIUM: 9.3 mg/dL (ref 8.9–10.3)
CO2: 17 mmol/L — AB (ref 22–32)
CREATININE: 0.95 mg/dL (ref 0.44–1.00)
Chloride: 104 mmol/L (ref 101–111)
GFR calc Af Amer: >60 mL/min (ref >60)
GFR calc non Af Amer: >60 mL/min (ref >60)
GLUCOSE: 128 mg/dL — AB (ref 65–99)
Potassium: 3.4 mmol/L — ABNORMAL LOW (ref 3.5–5.1)
Sodium: 137 mmol/L (ref 135–145)

## 2017-07-20 LAB — I-STAT BETA HCG BLOOD, ED (MC, WL, AP ONLY): I-stat hCG, quantitative: <5 m[IU]/mL (ref <5)

## 2017-07-20 MED ORDER — PROMETHAZINE HCL 25 MG PO TABS: 25.0000 mg | ORAL_TABLET | Freq: Four times a day (QID) | ORAL | 0 refills | Status: DC | PRN

## 2017-07-20 MED ORDER — ONDANSETRON HCL 4 MG/2ML IJ SOLN
INTRAMUSCULAR | Status: AC
  Administered 2017-07-20: 09:00:00
  Filled 2017-07-20: qty 2

## 2017-07-20 MED ORDER — SODIUM CHLORIDE 0.9 % IV BOLUS (SEPSIS)
1000.0000 mL | Freq: Once | INTRAVENOUS | Status: AC
  Administered 2017-07-20: 1000 mL via INTRAVENOUS

## 2017-07-20 MED ORDER — KETOROLAC TROMETHAMINE 30 MG/ML IJ SOLN
30.0000 mg | Freq: Once | INTRAMUSCULAR | Status: AC
  Administered 2017-07-20: 30 mg via INTRAVENOUS
  Filled 2017-07-20: qty 1

## 2017-07-20 MED ORDER — HYDROMORPHONE HCL 1 MG/ML IJ SOLN
INTRAMUSCULAR | Status: AC
  Administered 2017-07-20: 1 mg
  Filled 2017-07-20: qty 1

## 2017-07-20 MED ORDER — OXYCODONE-ACETAMINOPHEN 5-325 MG PO TABS: 1.0000 | ORAL_TABLET | Freq: Four times a day (QID) | ORAL | 0 refills | Status: DC | PRN

## 2017-07-20 NOTE — ED Triage Notes (Signed)
Pt in c/o flank pain that started this morning, states it woke her from sleep, no history of same, c/o nausea but denies vomiting, pain to left flank, pt hyperventilating in triage, alert and oriented

## 2017-07-20 NOTE — Discharge Instructions (Signed)
Return here as needed.  Follow-up with the urologist provided. 

## 2017-07-20 NOTE — ED Notes (Signed)
Patient transported to CT 

## 2017-07-20 NOTE — ED Provider Notes (Signed)
[facility] EMERGENCY DEPARTMENT Provider Note   CSN: [id number] Arrival date & time: 07/20/17  4098     History   Chief Complaint Chief Complaint  Patient presents with  . Flank Pain    HPI Wendy Robertson is a 37 y.o. female.  HPI Patient presents to the emergency department with sudden onset flank pain that awoke her from sleep this morning.  Patient states she is also had nausea but no vomiting.  The patient states that she is never had pain like this in the past.  She states the pain seems to radiate from the lower back to the lower left abdomen.  She states that she has not had any difficulty urinating or blood noted in the urine.  Patient states that she did not take any medications prior to arrival for her symptoms.  She states nothing seems to make the condition better or worse.  Patient states she is unable to get comfortable however.  The patient denies chest pain, shortness of breath, headache,blurred vision, neck pain, fever, cough, weakness, numbness, dizziness, anorexia, edema,  vomiting, diarrhea, rash, back pain, dysuria, hematemesis, bloody stool, near syncope, or syncope. Past Medical History:  Diagnosis Date  . Migraines     Patient Active Problem List   Diagnosis Date Noted  . Migraine with aura and without status migrainosus, not intractable 07/05/2017  . Right knee pain 04/23/2014  . Bilateral calf pain 11/05/2012  . Varicose veins of lower extremities with other complications 11/05/2012  . Ankle sprain 10/17/2011  . Tear of soleus muscle 09/19/2011    Past Surgical History:  Procedure Laterality Date  . LEEP    . TUBAL LIGATION      OB History    No data available       Home Medications    Prior to Admission medications   Medication Sig Start Date End Date Taking? Authorizing Provider  amitriptyline (ELAVIL) 25 MG tablet Take 1-2 tablets (25-50 mg total) by mouth at bedtime. 07/05/17   Myles Lipps, MD  prochlorperazine  (COMPAZINE) 10 MG tablet Take 1 tablet (10 mg total) by mouth daily as needed. Take at onset of migraine 07/05/17   Myles Lipps, MD    Family History Family History  Problem Relation Age of Onset  . Diabetes Mother   . Stroke Mother     Social History Social History   Tobacco Use  . Smoking status: Never Smoker  . Smokeless tobacco: Never Used  Substance Use Topics  . Alcohol use: No  . Drug use: No     Allergies   Patient has no known allergies.   Review of Systems Review of Systems All other systems negative except as documented in the HPI. All pertinent positives and negatives as reviewed in the HPI.  Physical Exam Updated Vital Signs BP (!) 156/112 (BP Location: Right Arm)   Pulse (!) 114   Temp 98.9 F (37.2 C) (Oral)   Resp 20   LMP 07/01/2017   SpO2 100%   Physical Exam  Constitutional: She is oriented to person, place, and time. She appears well-developed and well-nourished. No distress.  HENT:  Head: Normocephalic and atraumatic.  Mouth/Throat: Oropharynx is clear and moist.  Eyes: Pupils are equal, round, and reactive to light.  Neck: Normal range of motion. Neck supple.  Cardiovascular: Normal rate, regular rhythm and normal heart sounds. Exam reveals no gallop and no friction rub.  No murmur heard. Pulmonary/Chest: Effort normal and breath  sounds normal. No respiratory distress. She has no wheezes.  Abdominal: Soft. Bowel sounds are normal. She exhibits no distension and no mass. There is no tenderness. There is no guarding.  Neurological: She is alert and oriented to person, place, and time. She exhibits normal muscle tone. Coordination normal.  Skin: Skin is warm and dry. Capillary refill takes less than 2 seconds. No rash noted. No erythema.  Psychiatric: She has a normal mood and affect. Her behavior is normal.  Nursing note and vitals reviewed.    ED Treatments / Results  Labs (all labs ordered are listed, but only abnormal results are  displayed) Labs Reviewed  BASIC METABOLIC PANEL - Abnormal; Notable for the following components:      Result Value   Potassium 3.4 (*)    CO2 17 (*)    Glucose, Bld 128 (*)    Anion gap 16 (*)    All other components within normal limits  CBC  URINALYSIS, ROUTINE W REFLEX MICROSCOPIC  I-STAT BETA HCG BLOOD, ED ([facility], [facility], [facility] ONLY)    EKG  EKG Interpretation None       Radiology No results found.  Procedures Procedures (including critical care time)  Medications Ordered in ED Medications  ondansetron (ZOFRAN) 4 MG/2ML injection (  Given 07/20/17 0858)  HYDROmorphone (DILAUDID) 1 MG/ML injection (1 mg  Given 07/20/17 0858)  sodium chloride 0.9 % bolus 1,000 mL (1,000 mLs Intravenous New Bag/Given 07/20/17 0910)     Initial Impression / Assessment and Plan / ED Course  I have reviewed the triage vital signs and the nursing notes.  Pertinent labs & imaging results that were available during my care of the patient were reviewed by me and considered in my medical decision making (see chart for details).     Patient was given antiemetics along with pain control and she does seem more comfortable.  I feel that the most likely diagnosis at this point in the process is ureteral stone.  Patient is awaiting CT scan and laboratory testing.  Patient is advised of the plan and all questions were answered.  9:21 AM.  Patient was rechecked and seems more comfortable after pain medications.  Patient was rechecked second time and she has no pain at this time.  Patient has what appears to be a 2 mm stone is passed into the bladder.  I will refer her to urology as needed.  Told to return here for any worsening in her condition.  Patient agrees the plan and all questions were answered.  Final Clinical Impressions(s) / ED Diagnoses   Final diagnoses:  None    ED Discharge Orders    None       Charlestine NightLawyer, Daniela Siebers, PA-C 07/21/17 [id number]    Gwyneth SproutPlunkett, Whitney, MD 07/21/17 [phone]

## 2017-08-15 ENCOUNTER — Ambulatory Visit (INDEPENDENT_AMBULATORY_CARE_PROVIDER_SITE_OTHER): Payer: No Typology Code available for payment source | Admitting: Family Medicine

## 2017-08-15 ENCOUNTER — Other Ambulatory Visit: Payer: Self-pay

## 2017-08-15 ENCOUNTER — Encounter: Payer: Self-pay | Admitting: Family Medicine

## 2017-08-15 VITALS — BP 120/60 | HR 100 | Temp 98.5°F | Ht 64.17 in | Wt 145.0 lb

## 2017-08-15 DIAGNOSIS — G43109 Migraine with aura, not intractable, without status migrainosus: Secondary | ICD-10-CM | POA: Diagnosis not present

## 2017-08-15 DIAGNOSIS — J302 Other seasonal allergic rhinitis: Secondary | ICD-10-CM

## 2017-08-15 MED ORDER — CETIRIZINE HCL 10 MG PO TABS: 10.0000 mg | ORAL_TABLET | Freq: Every day | ORAL | 5 refills | Status: DC

## 2017-08-15 MED ORDER — FLUTICASONE PROPIONATE 50 MCG/ACT NA SUSP: 1.0000 | Freq: Two times a day (BID) | NASAL | 5 refills | Status: DC

## 2017-08-15 NOTE — Patient Instructions (Addendum)
1. Start using nasal saline washes, try over the counter phenylephrine for nasal decongestant.     IF you received an x-ray today, you will receive an invoice from [facility]. Please contact [facility] at [phone] with questions or concerns regarding your invoice.   IF you received labwork today, you will receive an invoice from [facility]. Please contact [facility] at [phone] with questions or concerns regarding your invoice.   Our billing staff will not be able to assist you with questions regarding bills from these companies.  You will be contacted with the lab results as soon as they are available. The fastest way to get your results is to activate your My Chart account. Instructions are located on the last page of this paperwork. If you have not heard from us regarding the results in 2 weeks, please contact this office.    Allergic Rhinitis, Adult Allergic rhinitis is an allergic reaction that affects the mucous membrane inside the nose. It causes sneezing, a runny or stuffy nose, and the feeling of mucus going down the back of the throat (postnasal drip). Allergic rhinitis can be mild to severe. There are two types of allergic rhinitis:  Seasonal. This type is also called hay fever. It happens only during certain seasons.  Perennial. This type can happen at any time of the year.  What are the causes? This condition happens when the body's defense system (immune system) responds to certain harmless substances called allergens as though they were germs.  Seasonal allergic rhinitis is triggered by pollen, which can come from grasses, trees, and weeds. Perennial allergic rhinitis may be caused by:  House dust mites.  Pet dander.  Mold spores.  What are the signs or symptoms? Symptoms of this condition include:  Sneezing.  Runny or stuffy nose (nasal congestion).  Postnasal drip.  Itchy nose.  Tearing of the eyes.  Trouble sleeping.  Daytime  sleepiness.  How is this diagnosed? This condition may be diagnosed based on:  Your medical history.  A physical exam.  Tests to check for related conditions, such as: ? Asthma. ? Pink eye. ? Ear infection. ? Upper respiratory infection.  Tests to find out which allergens trigger your symptoms. These may include skin or blood tests.  How is this treated? There is no cure for this condition, but treatment can help control symptoms. Treatment may include:  Taking medicines that block allergy symptoms, such as antihistamines. Medicine may be given as a shot, nasal spray, or pill.  Avoiding the allergen.  Desensitization. This treatment involves getting ongoing shots until your body becomes less sensitive to the allergen. This treatment may be done if other treatments do not help.  If taking medicine and avoiding the allergen does not work, new, stronger medicines may be prescribed.  Follow these instructions at home:  Find out what you are allergic to. Common allergens include smoke, dust, and pollen.  Avoid the things you are allergic to. These are some things you can do to help avoid allergens: ? Replace carpet with wood, tile, or vinyl flooring. Carpet can trap dander and dust. ? Do not smoke. Do not allow smoking in your home. ? Change your heating and air conditioning filter at least once a month. ? During allergy season:  Keep windows closed as much as possible.  Plan outdoor activities when pollen counts are lowest. This is usually during the evening hours.  When coming indoors, change clothing and shower before sitting on furniture or bedding.  Take  over-the-counter and prescription medicines only as told by your health care provider.  Keep all follow-up visits as told by your health care provider. This is important. Contact a health care provider if:  You have a fever.  You develop a persistent cough.  You make whistling sounds when you breathe (you  wheeze).  Your symptoms interfere with your normal daily activities. Get help right away if:  You have shortness of breath. Summary  This condition can be managed by taking medicines as directed and avoiding allergens.  Contact your health care provider if you develop a persistent cough or fever.  During allergy season, keep windows closed as much as possible. This information is not intended to replace advice given to you by your health care provider. Make sure you discuss any questions you have with your health care provider. Document Released: 01/23/2001 Document Revised: 06/07/2016 Document Reviewed: 06/07/2016 Elsevier Interactive Patient Education  [employer].

## 2017-08-15 NOTE — Progress Notes (Signed)
4/4/20195:17 PM  Wendy Robertson 23-Jan-1981, 37 y.o. female [id number]  Chief Complaint  Patient presents with  . Nasal Congestion    needing allergy medication for seasonal allergy    HPI:   Patient is a 37 y.o. female with past medical history significant for migraines and seasonal allergies who presents today requesting treatment for allergies.  She states that allergies seem to be one of her migraine triggers Recently started on TCA and compazine for migraines, tolerating ok, has upcoming appt with neuro Allergies are actually year round, clear runny nose, sneezing, itchiness Has never taken much for them Has no acute concerns today  Depression screen [facility] 2/9 08/15/2017 07/05/2017 07/01/2015  Decreased Interest 0 0 0  Down, Depressed, Hopeless 0 0 0  PHQ - 2 Score 0 0 0    No Known Allergies  Prior to Admission medications   Medication Sig Start Date End Date Taking? Authorizing Provider  amitriptyline (ELAVIL) 25 MG tablet Take 1-2 tablets (25-50 mg total) by mouth at bedtime. 07/05/17   Myles Lipps, MD  prochlorperazine (COMPAZINE) 10 MG tablet Take 1 tablet (10 mg total) by mouth daily as needed. Take at onset of migraine 07/05/17   Myles Lipps, MD    Past Medical History:  Diagnosis Date  . Migraines     Past Surgical History:  Procedure Laterality Date  . LEEP    . TUBAL LIGATION      Social History   Tobacco Use  . Smoking status: Never Smoker  . Smokeless tobacco: Never Used  Substance Use Topics  . Alcohol use: No    Family History  Problem Relation Age of Onset  . Diabetes Mother   . Stroke Mother     ROS Per hpi  OBJECTIVE:  Blood pressure 120/60, pulse 100, temperature 98.5 F (36.9 C), temperature source Oral, height 5' 4.17" (1.63 m), weight 145 lb (65.8 kg), last menstrual period 07/26/2017, SpO2 100 %.  Wt Readings from Last 3 Encounters:  08/15/17 145 lb (65.8 kg)  07/05/17 143 lb 3.2 oz (65 kg)  07/01/15 153 lb (69.4 kg)     Physical Exam  Constitutional: She is oriented to person, place, and time and well-developed, well-nourished, and in no distress.  HENT:  Head: Normocephalic and atraumatic.  Right Ear: Hearing, tympanic membrane, external ear and ear canal normal.  Left Ear: Hearing, tympanic membrane, external ear and ear canal normal.  Mouth/Throat: Oropharynx is clear and moist.  Eyes: Pupils are equal, round, and reactive to light. EOM are normal.  Neck: Neck supple.  Cardiovascular: Normal rate, regular rhythm and normal heart sounds. Exam reveals no gallop and no friction rub.  No murmur heard. Pulmonary/Chest: Effort normal and breath sounds normal. She has no wheezes. She has no rales.  Lymphadenopathy:    She has no cervical adenopathy.  Neurological: She is alert and oriented to person, place, and time. Gait normal.  Skin: Skin is warm and dry.     ASSESSMENT and PLAN  1. Seasonal allergies Discussed new meds r/se/b. Use of nasal saline washes and decongestant. Will escalate meds as needed. Patient educational handout given. Consider referral to allergist. - cetirizine (ZYRTEC) 10 MG tablet; Take 1 tablet (10 mg total) by mouth daily. - fluticasone (FLONASE) 50 MCG/ACT nasal spray; Place 1 spray into both nostrils 2 (two) times daily.  2. Migraine with aura and without status migrainosus, not intractable Cont with TCA and compazine, pending upcoming appt with neuro  Return  in about 1 month (around 09/12/2017).    Myles LippsIrma M Santiago, MD [facility] at [facility] [address] [place], [place] [address] Ph.  [phone] Fax [phone]

## 2017-08-16 MED FILL — FLUTICASONE PROP 50 MCG SPR: 50 | 30 days supply | Qty: 16 | Fill #0

## 2017-09-16 ENCOUNTER — Encounter: Payer: Self-pay | Admitting: Family Medicine

## 2017-10-16 ENCOUNTER — Telehealth: Payer: Self-pay | Admitting: Neurology

## 2017-10-16 ENCOUNTER — Encounter: Payer: Self-pay | Admitting: Neurology

## 2017-10-16 ENCOUNTER — Ambulatory Visit: Payer: No Typology Code available for payment source | Admitting: Neurology

## 2017-10-16 ENCOUNTER — Encounter: Payer: Self-pay | Admitting: Family Medicine

## 2017-10-16 VITALS — BP 120/81 | HR 85 | Ht 64.0 in | Wt 152.2 lb

## 2017-10-16 DIAGNOSIS — R519 Headache, unspecified: Secondary | ICD-10-CM

## 2017-10-16 DIAGNOSIS — H547 Unspecified visual loss: Secondary | ICD-10-CM

## 2017-10-16 DIAGNOSIS — G43711 Chronic migraine without aura, intractable, with status migrainosus: Secondary | ICD-10-CM | POA: Diagnosis not present

## 2017-10-16 DIAGNOSIS — R51 Headache with orthostatic component, not elsewhere classified: Secondary | ICD-10-CM

## 2017-10-16 DIAGNOSIS — G43109 Migraine with aura, not intractable, without status migrainosus: Secondary | ICD-10-CM | POA: Diagnosis not present

## 2017-10-16 DIAGNOSIS — R200 Anesthesia of skin: Secondary | ICD-10-CM

## 2017-10-16 NOTE — Patient Instructions (Signed)
Start Aimovig   Erenumab: Drug information Copyright [phone] [employer] All rights reserved. (For additional information see "Erenumab: Patient drug information")  For abbreviations and symbols that may be used in [employer] (show table) Brand Names: [place]  Aimovig;  Aimovig 140 Dose  Brand Names: [place]  Aimovig  Pharmacologic Category  Calcitonin Gene-Related Peptide (CGRP) Receptor Antagonist;  Monoclonal Antibody, CGRP Antagonist  Dosing: Adult Migraine prophylaxis: SubQ: Initial: 70 mg once a month; some patients may benefit from 140 mg once a month Missed dose: Administer missed dose as soon as possible, and schedule next dose for 1 month from date of the last dose. Dosing: Renal Impairment: Adult There are no dosage adjustments provided in the manufacturer's labeling (has not been studied); renal impairment is not expected to change the pharmacokinetics of erenumab. Dosing: Hepatic Impairment: Adult There are no dosage adjustments provided in the manufacturer's labeling (has not been studied); hepatic impairment is not expected to change the pharmacokinetics of erenumab. Dosing: Geriatric Refer to adult dosing. Dosage Forms: [place] Excipient information presented when available (limited, particularly for generics); consult specific product labeling. Solution Auto-injector, Subcutaneous [preservative free]:  Aimovig: erenumab-aooe 70 mg/mL (1 mL); erenumab-aooe 140 mg/mL (1 mL) [contains polysorbate 80] Aimovig 140 Dose: erenumab-aooe 70 mg/mL (1 mL) [contains polysorbate 80] Generic Equivalent Available: [place] No Dosage Forms: [place] Excipient information presented when available (limited, particularly for generics); consult specific product labeling. Solution Auto-injector, Subcutaneous:  Aimovig: 70 mg/mL (1 mL) [contains polysorbate 80] Administration: Adult SubQ: For subcutaneous use only; intended for self-administration. Keep out of direct sunlight and allow to  come to room temperature for 30 minutes before administration. Do not warm using a heat source (eg, hot water, microwave) and do not shake. Administer in abdomen (avoiding 2 inches around the navel), thigh or upper arm, avoiding areas of skin that are tender, bruised, red or hard. Deliver entire contents of single-use autoinjector or prefilled syringe. Use: Labeled Indications Migraine prophylaxis: Preventive treatment of migraine in adults Adverse Reactions 1% to 10%: Gastrointestinal: Constipation (3%) Immunologic: Antibody development (3% to 6%) Local: Injection site reaction (5% to 6%) Neuromuscular & skeletal: Muscle cramps (?2%), muscle spasm (?2%) Frequency not defined: Dermatologic: Injection site pruritus Local: Erythema at injection site, pain at injection site <1%, postmarketing, and/or case reports: Anaphylaxis, angioedema, hypersensitivity reaction Contraindications Serious hypersensitivity to erenumab or any component of the formulation. Warnings/Precautions Concerns related to adverse effects: Marland Kitchen Hypersensitivity: Hypersensitivity reactions, including rash, angioedema, and anaphylaxis, have been reported. Most reactions are mild to moderate and occur within hours after administration, but some may be delayed for >1 week. If a hypersensitivity reaction occurs, discontinue treatment and institute appropriate therapy. Dosage form specific issues: . Latex: The packaging (needle shield of auto-injector and needle cap of prefilled syringe) may contain latex. Metabolism/Transport Effects None known. Drug Interactions   (For additional information: Launch drug interactions program)   Belimumab: Monoclonal Antibodies may enhance the adverse/toxic effect of Belimumab.  Risk X: Avoid combination Pregnancy Implications Adverse events were not observed in animal reproduction studies. Breast-Feeding Considerations It is not known if erenumab is present in breast milk. According to the  manufacturer, the decision to breastfeed during therapy should consider the risk of infant exposure, the benefits of breastfeeding to the infant, and benefits of treatment to the mother. Monitoring Parameters Number of monthly migraine days Mechanism of Action Erenumab is a human monoclonal antibody that antagonizes calcitonin gene-related peptide (CGRP) receptor function. Pharmacodynamics and Pharmacokinetics Distribution: Vz: 3.86 L Metabolism: Via a nonspecific, nonsaturable  proteolytic pathway Bioavailability: 82% Half-life elimination: 28 days Time to peak: ~6 days Pricing: [place] Solution Auto-injector (Aimovig 140 Dose Subcutaneous) 70 mg/mL (per mL): $345.00 Solution Auto-injector (Aimovig Subcutaneous) 70 mg/mL (per mL): $690.00 140 mg/mL (per mL): $690.00 Disclaimer: A representative AWP (Average Wholesale Price) price or price range is provided as reference price only. A range is provided when more than one manufacturer's AWP price is available and uses the low and high price reported by the manufacturers to determine the range. The pricing data should be used for benchmarking purposes only, and as such should not be used alone to set or adjudicate any prices for reimbursement or purchasing functions or considered to be an exact price for a single product and/or manufacturer. [employer] expressly disclaims all warranties of any kind or nature, whether express or implied, and assumes no liability with respect to accuracy of price or price range data published in its solutions. In no event shall [employer] be liable for special, indirect, incidental, or consequential damages arising from use of price or price range data. Pricing data is updated monthly. Brand Names: International  Aimovig (AT, AU, CZ, EE, GB, HR, HU, LT, LV, NO, PT, RO, SK)

## 2017-10-16 NOTE — Telephone Encounter (Signed)
Dr. Lucia GaskinsAhern brought a new Botox patient. Dr. Lucia GaskinsAhern told us that we could use any 4:30 pm slot that she has. I am not able to create the time slot. The patient is coming in on Wednesday 10/23/17 at 4:30 pm. Toma CopierBethany told me she would create the slot for the patient.

## 2017-10-16 NOTE — Progress Notes (Signed)
[facility]    Provider:  Dr Lucia Gaskins Referring Provider: Myles Lipps, MD Primary Care Physician:  Myles Lipps, MD  CC:  migraine  HPI:  Wendy Robertson is a 37 y.o. female here as a referral from Dr. Durene Cal for migraines.  Migraine started in high school.  No other significant past medical history.  Mother has migraines. No aura with migraines, can start on either side, behind the eyes, she wakes with them, caffeine can trigger them, +nausea, no vomiting, +light and sound sensitivity, moving makes it worse. They can last 2-3 days. She has daily headaches and at least 15 migrainous.  Daily headache disorder. She is on elavil. She takes compazine. No medication overuse. Has had occ aura in the past, the aura is small, spots and then expands in size, then gets big. She gets numbness in the fingers up to the arms, tongue goes numb. She has tried multiple medications. She wakes with headaches and they can be worse positionally. At this frequency and severity for over a year. Allergies can trigger.  She is having difficulty tolerating Elavil due to fatigue and cognitive impairments.  Tries to get good sleep, exercise, discussed with her.  Significant family history of migraines.no medication overuse. No other focal neurologic deficits, associated symptoms, inciting events or modifiable factors.  Meds tried: elavil, butalbitol, zofran, phenergan, inderal, topamax, zonisamide, maxalt  Reviewed notes, labs and imaging from outside physicians, which showed:   Bmp with cr 0.95 and BUN 14  Reviewed referring physician notes.  Patient has had migraines, intractable, allergies can trigger.  They started her on a tricyclic antidepressant and Compazine.  They started her on Elavil 25 mg.  Physical and neurologic exams have been normal.  She has migraines without and with aura.  They discussed allergies and tried Zyrtec.  Headaches becoming more intense, now also including nausea, they  started in high school, multiple migraines a month, with and without aura, bilateral small sharp color flashes that expand in size followed by bitemporal headache tingling of her fingertips and tongue and numbness.  By report normal head CT scan per patient however no records to review.  [facility] on [facility] 2018.  Reviewed these notes by neurologist diagnosed with intractable migraines without and with aura.  Neurologic exam was normal.  At that time she was started on Zonegran and Maxalt.  Review of Systems: Patient complains of symptoms per HPI as well as the following symptoms: migraines, fatigue. Pertinent negatives and positives per HPI. All others negative.   Social History   Socioeconomic History  . Marital status: Married    Spouse name: Not on file  . Number of children: Not on file  . Years of education: Not on file  . Highest education level: Not on file  Occupational History  . Not on file  Social Needs  . Financial resource strain: Not on file  . Food insecurity:    Worry: Not on file    Inability: Not on file  . Transportation needs:    Medical: Not on file    Non-medical: Not on file  Tobacco Use  . Smoking status: Never Smoker  . Smokeless tobacco: Never Used  Substance and Sexual Activity  . Alcohol use: No  . Drug use: No  . Sexual activity: Yes    Birth control/protection: None    Comment: BTL  Lifestyle  . Physical activity:    Days per week: Not on file  Minutes per session: Not on file  . Stress: Not on file  Relationships  . Social connections:    Talks on phone: Not on file    Gets together: Not on file    Attends religious service: Not on file    Active member of club or organization: Not on file    Attends meetings of clubs or organizations: Not on file    Relationship status: Not on file  . Intimate partner violence:    Fear of current or ex partner: Not on file    Emotionally abused: Not on file    Physically abused:  Not on file    Forced sexual activity: Not on file  Other Topics Concern  . Not on file  Social History Narrative  . Not on file    Family History  Problem Relation Age of Onset  . Diabetes Mother   . Stroke Mother   . Migraines Mother     Past Medical History:  Diagnosis Date  . Migraines     Past Surgical History:  Procedure Laterality Date  . LEEP    . TUBAL LIGATION      Current Outpatient Medications  Medication Sig Dispense Refill  . amitriptyline (ELAVIL) 25 MG tablet Take 1-2 tablets (25-50 mg total) by mouth at bedtime. 60 tablet 1  . cetirizine (ZYRTEC) 10 MG tablet Take 1 tablet (10 mg total) by mouth daily. 30 tablet 5  . fluticasone (FLONASE) 50 MCG/ACT nasal spray Place 1 spray into both nostrils 2 (two) times daily. 16 g 5  . prochlorperazine (COMPAZINE) 10 MG tablet Take 1 tablet (10 mg total) by mouth daily as needed. Take at onset of migraine 30 tablet 0   No current facility-administered medications for this visit.     Allergies as of 10/16/2017 - Review Complete 10/16/2017  Allergen Reaction Noted  . Propranolol Other (See Comments) 12/20/2015  . Sumatriptan Palpitations 06/02/2015  . Topiramate Other (See Comments) 06/05/2015    Vitals: BP 120/81   Pulse 85   Ht 5\' 4"  (1.626 m)   Wt 152 lb 3.2 oz (69 kg)   BMI 26.13 kg/m  Last Weight:  Wt Readings from Last 1 Encounters:  10/16/17 152 lb 3.2 oz (69 kg)   Last Height:   Ht Readings from Last 1 Encounters:  10/16/17 5\' 4"  (1.626 m)   Physical exam: Exam: Gen: NAD, conversant, well nourised, well groomed                     CV: RRR, no MRG. No Carotid Bruits. No peripheral edema, warm, nontender Eyes: Conjunctivae clear without exudates or hemorrhage  Neuro: Detailed Neurologic Exam  Speech:    Speech is normal; fluent and spontaneous with normal comprehension.  Cognition:    The patient is oriented to person, place, and time;     recent and remote memory intact;     language  fluent;     normal attention, concentration,     fund of knowledge Cranial Nerves:    The pupils are equal, round, and reactive to light. The fundi are normal and spontaneous venous pulsations are present. Visual fields are full to finger confrontation. Extraocular movements are intact. Trigeminal sensation is intact and the muscles of mastication are normal. The face is symmetric. The palate elevates in the midline. Hearing intact. Voice is normal. Shoulder shrug is normal. The tongue has normal motion without fasciculations.   Coordination:    Normal finger to  nose and heel to shin. Normal rapid alternating movements.   Gait:    Heel-toe and tandem gait are normal.   Motor Observation:    No asymmetry, no atrophy, and no involuntary movements noted. Tone:    Normal muscle tone.    Posture:    Posture is normal. normal erect    Strength:    Strength is V/V in the upper and lower limbs.      Sensation: intact to LT     Reflex Exam:  DTR's:    Deep tendon reflexes in the upper and lower extremities are normal bilaterally.   Toes:    The toes are downgoing bilaterally.   Clonus:    Clonus is absent.       Assessment/Plan: 37 year old with chronic intractable migraines.  Has failed multiple medications.  Currently doing poorly on Elavil.  Had a long discussion about preventative and acute management.  At this time feel she would be an excellent candidate for Botox for migraine.  Discussed risks and benefits.  Patient not tolerating Elavil, she can continue to take it or stop it.  She should continue Compazine for acute management.  May try to introduce a triptan in the future.  MRI of the brain with and without contrast is indicated due to concerning qualities of worsening, positional, morning headaches, vision loss, numbness in arms to evaluate for space-occupying mass, Multiple Sclerosis, Chiari malformation, intracranial hypertension or any other lesion or intracranial  etiology for her headaches.  Discussed Aimovig in the future will discuss at a later time**  Meds tried: elavil, butalbitol, zofran, phenergan, inderal, topamax, zonisamide, maxalt  Orders Placed This Encounter  Procedures  . MR BRAIN W WO CONTRAST     Discussed: To prevent or relieve headaches, try the following: Cool Compress. Lie down and place a cool compress on your head.  Avoid headache triggers. If certain foods or odors seem to have triggered your migraines in the past, avoid them. A headache diary might help you identify triggers.  Include physical activity in your daily routine. Try a daily walk or other moderate aerobic exercise.  Manage stress. Find healthy ways to cope with the stressors, such as delegating tasks on your to-do list.  Practice relaxation techniques. Try deep breathing, yoga, massage and visualization.  Eat regularly. Eating regularly scheduled meals and maintaining a healthy diet might help prevent headaches. Also, drink plenty of fluids.  Follow a regular sleep schedule. Sleep deprivation might contribute to headaches Consider biofeedback. With this mind-body technique, you learn to control certain bodily functions - such as muscle tension, heart rate and blood pressure - to prevent headaches or reduce headache pain.    Proceed to emergency room if you experience new or worsening symptoms or symptoms do not resolve, if you have new neurologic symptoms or if headache is severe, or for any concerning symptom.   Provided education and documentation from American headache Society toolbox including articles on: chronic migraine medication overuse headache, chronic migraines, prevention of migraines, behavioral and other nonpharmacologic treatments for headache.     Cc: Myles LippsSantiago, Irma M, MD  Naomie DeanAntonia Chaney Maclaren, MD  [facility] [address] [place], [place] [address]  Phone [phone] Fax [phone]

## 2017-10-17 ENCOUNTER — Telehealth: Payer: Self-pay | Admitting: Neurology

## 2017-10-17 ENCOUNTER — Encounter: Payer: Self-pay | Admitting: Neurology

## 2017-10-17 NOTE — Telephone Encounter (Signed)
MR Brain w/wo contrast Dr. Valentino SaxonAhern [facility] Berkley Harveyauth: [id number] (exp. 11/20/17) good for the one day. [facility]

## 2017-10-17 NOTE — Telephone Encounter (Signed)
Patient called back and got her rescheduled for June 26th, clinicals have already been submitted so the status will just need to be checked.

## 2017-10-17 NOTE — Telephone Encounter (Signed)
I called to check benefits and authorization requirements with the patients insurance. I spoke with Kathlene NovemberMike who stated authorization is required. It will take 10 days to complete. Authorization was initiated with Kathlene NovemberMike. Clinicals can be faxed to [phone].   I called the patient to reschedule her apt but she did not answer so I left a VM asking her to call me back.   I faxed over clinicals to her insurance.

## 2017-10-17 NOTE — Telephone Encounter (Signed)
lvm for pt to call back about scheduling mri & i can't do auth until patient is scheduled w/[facility] plan

## 2017-10-17 NOTE — Telephone Encounter (Signed)
Noted, thank you

## 2017-10-22 NOTE — Telephone Encounter (Signed)
I call [facility] to check the status and Aurther Lofterry informed me that she has to check to see if the patients needs to go to case management or not. She stated she would check this in the morning and get back to me.

## 2017-10-23 ENCOUNTER — Ambulatory Visit: Payer: Self-pay | Admitting: Neurology

## 2017-10-28 NOTE — Telephone Encounter (Signed)
I called to check status and spoke with Alecia and she stated that it was still pending.

## 2017-10-29 NOTE — Telephone Encounter (Signed)
I called to check status of the Botox authorization request. I spoke with Kathlene NovemberMike who stated it was still pending, he asked if I wanted to speak with the person handling the case and transferred me over.   [facility] who said she had sent it to case management to look at but no decision has been made. She said she was going to send it to case management again. I asked how long the turn around would be because I was originally told ten days and it has now been 12. She told me she did not know what the turn around time would be but it should be done before the 26th.

## 2017-10-30 ENCOUNTER — Encounter: Payer: Self-pay | Admitting: Family Medicine

## 2017-10-30 ENCOUNTER — Ambulatory Visit (INDEPENDENT_AMBULATORY_CARE_PROVIDER_SITE_OTHER): Payer: No Typology Code available for payment source | Admitting: Family Medicine

## 2017-10-30 ENCOUNTER — Other Ambulatory Visit: Payer: Self-pay

## 2017-10-30 VITALS — BP 124/72 | HR 89 | Temp 99.2°F | Resp 18 | Ht 64.57 in | Wt 154.4 lb

## 2017-10-30 DIAGNOSIS — R05 Cough: Secondary | ICD-10-CM | POA: Diagnosis not present

## 2017-10-30 DIAGNOSIS — J302 Other seasonal allergic rhinitis: Secondary | ICD-10-CM | POA: Diagnosis not present

## 2017-10-30 DIAGNOSIS — G43109 Migraine with aura, not intractable, without status migrainosus: Secondary | ICD-10-CM

## 2017-10-30 DIAGNOSIS — R059 Cough, unspecified: Secondary | ICD-10-CM

## 2017-10-30 MED ORDER — MONTELUKAST SODIUM 10 MG PO TABS: 10.0000 mg | ORAL_TABLET | Freq: Every day | ORAL | 3 refills | Status: DC

## 2017-10-30 MED FILL — MONTELUKAST SOD 10 MG TAB: 10 | 30 days supply | Qty: 30 | Fill #0

## 2017-10-30 NOTE — Progress Notes (Signed)
6/19/20198:11 AM  Wendy Robertson [birth year], 37 y.o. female [id number]  Chief Complaint  Patient presents with  . Migraine    f/u    HPI:   Patient is a 37 y.o. female with past medical history significant for migraines and seasonal allergies who presents today for followup  Migraines - being seen now by Dr. Judieth Keens, neurologist - headache specialist She has taken her off all oral meds, as their might be rebound component Given an injection of aimovig Will start botox next week MRI scheduled  Seasonal allergies - not well controlled on zyrtec and flonase. Still needs to limit time outside, will get dry cough, sometimes with minimal mucous. Rare chest tightness, Sob, no wheezing. Cough worse at night, denies GERD. Reminds her of her children's asthma  Fall Risk  10/30/2017 08/15/2017 07/05/2017 04/23/2014  Falls in the past year? No No No No     Depression screen [facility] 2/9 10/30/2017 08/15/2017 07/05/2017  Decreased Interest 0 0 0  Down, Depressed, Hopeless 0 0 0  PHQ - 2 Score 0 0 0    Allergies  Allergen Reactions  . Propranolol Other (See Comments)    Doesn't like the taste  . Sumatriptan Palpitations  . Topiramate Other (See Comments)    Tingling    Prior to Admission medications   Medication Sig Start Date End Date Taking? Authorizing Provider  cetirizine (ZYRTEC) 10 MG tablet Take 1 tablet (10 mg total) by mouth daily. 08/15/17  Yes Myles Lipps, MD  fluticasone (FLONASE) 50 MCG/ACT nasal spray Place 1 spray into both nostrils 2 (two) times daily. 08/15/17  Yes Myles Lipps, MD  amitriptyline (ELAVIL) 25 MG tablet Take 1-2 tablets (25-50 mg total) by mouth at bedtime. Patient not taking: Reported on 10/30/2017 07/05/17   Myles Lipps, MD  prochlorperazine (COMPAZINE) 10 MG tablet Take 1 tablet (10 mg total) by mouth daily as needed. Take at onset of migraine Patient not taking: Reported on 10/30/2017 07/05/17   Myles Lipps, MD    Past Medical History:    Diagnosis Date  . Migraines     Past Surgical History:  Procedure Laterality Date  . LEEP    . TUBAL LIGATION      Social History   Tobacco Use  . Smoking status: Never Smoker  . Smokeless tobacco: Never Used  Substance Use Topics  . Alcohol use: Yes    Comment: occ    Family History  Problem Relation Age of Onset  . Diabetes Mother   . Stroke Mother   . Migraines Mother     ROS Per hpi Neg fever, chills, sore throat, ear pain  OBJECTIVE:  Blood pressure 124/72, pulse 89, temperature 99.2 F (37.3 C), temperature source Oral, resp. rate 18, height 5' 4.57" (1.64 m), weight 154 lb 6.4 oz (70 kg), last menstrual period 10/09/2017, SpO2 99 %.  Physical Exam  Constitutional: She is oriented to person, place, and time. She appears well-developed and well-nourished.  HENT:  Head: Normocephalic and atraumatic.  Right Ear: Hearing, tympanic membrane, external ear and ear canal normal.  Left Ear: Hearing, tympanic membrane, external ear and ear canal normal.  Mouth/Throat: Oropharynx is clear and moist.  Eyes: Pupils are equal, round, and reactive to light. EOM are normal.  Neck: Neck supple.  Cardiovascular: Normal rate, regular rhythm and normal heart sounds. Exam reveals no gallop and no friction rub.  No murmur heard. Pulmonary/Chest: Effort normal and breath sounds normal. She has no  wheezes. She has no rales.  Lymphadenopathy:    She has no cervical adenopathy.  Neurological: She is alert and oriented to person, place, and time.  Skin: Skin is warm and dry.  Psychiatric: She has a normal mood and affect.  Nursing note and vitals reviewed.  Peak flow reading is 450, about 105 % of predicted.  ASSESSMENT and PLAN  1. Seasonal allergies 2. Cough Uncontrolled. normal peak flow. Adding singulair. New med r/se/b discussed. Consider referral to asthma and allergies.   3. Migraine with aura and without status migrainosus, not intractable Per neuro  Other  orders - montelukast (SINGULAIR) 10 MG tablet; Take 1 tablet (10 mg total) by mouth at bedtime.  Return in about 1 month (around 11/27/2017).    Myles LippsIrma M Santiago, MD [facility] [address] [place], [place] [address] Ph.  [phone] Fax [phone]

## 2017-10-30 NOTE — Telephone Encounter (Signed)
I called to check status of the patient authorization request. I spoke with Alecia who said it was still pending. She asked if I would like to speak with the nurse assigned to the case and I said yes. I was transferred to a VM. I did not leave a message.

## 2017-10-30 NOTE — Patient Instructions (Signed)
     IF you received an x-ray today, you will receive an invoice from [facility]. Please contact [facility] at [phone] with questions or concerns regarding your invoice.   IF you received labwork today, you will receive an invoice from [facility]. Please contact [facility] at [phone] with questions or concerns regarding your invoice.   Our billing staff will not be able to assist you with questions regarding bills from these companies.  You will be contacted with the lab results as soon as they are available. The fastest way to get your results is to activate your My Chart account. Instructions are located on the last page of this paperwork. If you have not heard from us regarding the results in 2 weeks, please contact this office.     

## 2017-10-31 NOTE — Telephone Encounter (Signed)
Received a fax stating that the patient was covered and approved for botox injections in our office through December. Authorization number is [id number] and dates are (10/30/17-05/01/18).

## 2017-11-04 ENCOUNTER — Encounter: Payer: Self-pay | Admitting: Neurology

## 2017-11-06 ENCOUNTER — Ambulatory Visit (INDEPENDENT_AMBULATORY_CARE_PROVIDER_SITE_OTHER): Payer: No Typology Code available for payment source | Admitting: Neurology

## 2017-11-06 ENCOUNTER — Encounter: Payer: Self-pay | Admitting: Neurology

## 2017-11-06 VITALS — BP 120/77 | HR 78 | Ht 63.0 in | Wt 159.0 lb

## 2017-11-06 DIAGNOSIS — G43711 Chronic migraine without aura, intractable, with status migrainosus: Secondary | ICD-10-CM

## 2017-11-06 MED ORDER — ALPRAZOLAM 0.25 MG PO TABS: ORAL_TABLET | ORAL | 0 refills | Status: DC

## 2017-11-06 MED ORDER — ERENUMAB-AOOE 140 MG/ML ~~LOC~~ SOAJ: 140.0000 mg | SUBCUTANEOUS | 11 refills | Status: DC

## 2017-11-06 MED ORDER — RIZATRIPTAN BENZOATE 10 MG PO TBDP: 10.0000 mg | ORAL_TABLET | ORAL | 11 refills | Status: DC | PRN

## 2017-11-06 MED FILL — RIZATRIPTAN 10 MG ODT: 10 | 30 days supply | Qty: 9 | Fill #0

## 2017-11-06 MED FILL — ALPRAZolam 0.25 MG TABS: 0.25 | 5 days supply | Qty: 5 | Fill #0

## 2017-11-06 NOTE — Progress Notes (Signed)
Botox- 100 units x 2 vials Lot: [id number] Expiration: 05/2020 NDC: [id number]  Bacteriostatic 0.9% Sodium Chloride- 4mL total Lot: [id number] Expiration: 09/12/2018 NDC: [id number]  Dx: I69.629G43.711 B/B

## 2017-11-06 NOTE — Progress Notes (Signed)
Consent Form Botulism Toxin Injection For Chronic Migraine   History: She has daily headaches and at least 15 migrainous.  Daily headache disorder.  +masseters,temples,levators  Reviewed orally with patient, additionally signature is on file:  Botulism toxin has been approved by the Federal drug administration for treatment of chronic migraine. Botulism toxin does not cure chronic migraine and it may not be effective in some patients.  The administration of botulism toxin is accomplished by injecting a small amount of toxin into the muscles of the neck and head. Dosage must be titrated for each individual. Any benefits resulting from botulism toxin tend to wear off after 3 months with a repeat injection required if benefit is to be maintained. Injections are usually done every 3-4 months with maximum effect peak achieved by about 2 or 3 weeks. Botulism toxin is expensive and you should be sure of what costs you will incur resulting from the injection.  The side effects of botulism toxin use for chronic migraine may include:   -Transient, and usually mild, facial weakness with facial injections  -Transient, and usually mild, head or neck weakness with head/neck injections  -Reduction or loss of forehead facial animation due to forehead muscle weakness  -Eyelid drooping  -Dry eye  -Pain at the site of injection or bruising at the site of injection  -Double vision  -Potential unknown long term risks  Contraindications: You should not have Botox if you are pregnant, nursing, allergic to albumin, have an infection, skin condition, or muscle weakness at the site of the injection, or have myasthenia gravis, Lambert-Eaton syndrome, or ALS.  It is also possible that as with any injection, there may be an allergic reaction or no effect from the medication. Reduced effectiveness after repeated injections is sometimes seen and rarely infection at the injection site may occur. All care will be taken  to prevent these side effects. If therapy is given over a long time, atrophy and wasting in the muscle injected may occur. Occasionally the patient's become refractory to treatment because they develop antibodies to the toxin. In this event, therapy needs to be modified.  I have read the above information and consent to the administration of botulism toxin.    BOTOX PROCEDURE NOTE FOR MIGRAINE HEADACHE    Contraindications and precautions discussed with patient(above). Aseptic procedure was observed and patient tolerated procedure. Procedure performed by Dr. Artemio Aly  The condition has existed for more than 6 months, and pt does not have a diagnosis of ALS, Myasthenia Gravis or Lambert-Eaton Syndrome.  Risks and benefits of injections discussed and pt agrees to proceed with the procedure.  Written consent obtained  These injections are medically necessary. Pt  receives good benefits from these injections. These injections do not cause sedations or hallucinations which the oral therapies may cause.  Indication/Diagnosis: chronic migraine BOTOX(J0585) injection was performed according to protocol by [employer]. 200 units of BOTOX was dissolved into 4 cc NS.   NDC: [address]-30   Description of procedure:  The patient was placed in a sitting position. The standard protocol was used for Botox as follows, with 5 units of Botox injected at each site:   -Procerus muscle, midline injection  -Corrugator muscle, bilateral injection  -Frontalis muscle, bilateral injection, with 2 sites each side, medial injection was performed in the upper one third of the frontalis muscle, in the region vertical from the medial inferior edge of the superior orbital rim. The lateral injection was again in the upper one third of  the forehead vertically above the lateral limbus of the cornea, 1.5 cm lateral to the medial injection site.  -Temporalis muscle injection, 4 sites, bilaterally. The first injection was  3 cm above the tragus of the ear, second injection site was 1.5 cm to 3 cm up from the first injection site in line with the tragus of the ear. The third injection site was 1.5-3 cm forward between the first 2 injection sites. The fourth injection site was 1.5 cm posterior to the second injection site.  -Occipitalis muscle injection, 3 sites, bilaterally. The first injection was done one half way between the occipital protuberance and the tip of the mastoid process behind the ear. The second injection site was done lateral and superior to the first, 1 fingerbreadth from the first injection. The third injection site was 1 fingerbreadth superiorly and medially from the first injection site.  -Cervical paraspinal muscle injection, 2 sites, bilateral knee first injection site was 1 cm from the midline of the cervical spine, 3 cm inferior to the lower border of the occipital protuberance. The second injection site was 1.5 cm superiorly and laterally to the first injection site.  -Trapezius muscle injection was performed at 3 sites, bilaterally. The first injection site was in the upper trapezius muscle halfway between the inflection point of the neck, and the acromion. The second injection site was one half way between the acromion and the first injection site. The third injection was done between the first injection site and the inflection point of the neck.   Will return for repeat injection in 3 months.   A 200 unit sof Botox was used, 155 units were injected, the rest of the Botox was wasted. The patient tolerated the procedure well, there were no complications of the above procedure.

## 2017-11-07 ENCOUNTER — Telehealth: Payer: Self-pay

## 2017-11-07 NOTE — Telephone Encounter (Signed)
Completed PA for aimovig. Sent to [employer] via covermymeds. Key: [id number]. Should have a determination in 3-5 business days.

## 2017-11-08 MED FILL — AIMOVIG 140 MG/ML SOAJ: 140 | 28 days supply | Qty: 1 | Fill #0

## 2017-11-11 NOTE — Telephone Encounter (Addendum)
Received this notice from [employer]: "The request has been approved. The authorization is effective for a maximum of 6 fills from 11/09/2017 to 05/09/2018, as long as the member is enrolled in their current health plan. The request was approved as submitted. This request is approved for 1mL (1 auto-injector) per 30 days. Renewal requires that the patient has experienced a reduction in migraine or headache frequency of at least 2 days per month, OR that the patient has experienced a reduction in migraine severity OR migraine duration with Aimovig therapy. Please note that maintenance drugs must be filled at [facility] [phone]). Please have the pharmacy contact [employer] Customer Service at [phone] if billing assistance is required. A written notification letter will follow with additional details."  [facility] notified.

## 2017-11-20 ENCOUNTER — Ambulatory Visit: Payer: No Typology Code available for payment source

## 2017-11-20 DIAGNOSIS — R519 Headache, unspecified: Secondary | ICD-10-CM

## 2017-11-20 DIAGNOSIS — G43711 Chronic migraine without aura, intractable, with status migrainosus: Secondary | ICD-10-CM | POA: Diagnosis not present

## 2017-11-20 DIAGNOSIS — R51 Headache with orthostatic component, not elsewhere classified: Secondary | ICD-10-CM

## 2017-11-20 DIAGNOSIS — H547 Unspecified visual loss: Secondary | ICD-10-CM | POA: Diagnosis not present

## 2017-11-20 DIAGNOSIS — G43109 Migraine with aura, not intractable, without status migrainosus: Secondary | ICD-10-CM | POA: Diagnosis not present

## 2017-11-20 DIAGNOSIS — R200 Anesthesia of skin: Secondary | ICD-10-CM

## 2017-11-20 MED ORDER — GADOPENTETATE DIMEGLUMINE 469.01 MG/ML IV SOLN
15.0000 mL | Freq: Once | INTRAVENOUS | Status: AC | PRN
  Administered 2017-11-20: 15 mL via INTRAVENOUS

## 2017-11-25 ENCOUNTER — Telehealth: Payer: Self-pay | Admitting: *Deleted

## 2017-11-25 NOTE — Telephone Encounter (Addendum)
Called pt & LVM (ok per DPR) informing her that her MRI brain is normal. Left office number and encouraged call back if any questions.   ----- Message from Anson FretAntonia B Ahern, MD sent at 11/22/2017  9:09 PM EDT ----- MRI brain normal

## 2017-11-30 ENCOUNTER — Encounter: Payer: Self-pay | Admitting: Family Medicine

## 2017-11-30 ENCOUNTER — Ambulatory Visit (INDEPENDENT_AMBULATORY_CARE_PROVIDER_SITE_OTHER): Payer: No Typology Code available for payment source | Admitting: Family Medicine

## 2017-11-30 ENCOUNTER — Other Ambulatory Visit: Payer: Self-pay

## 2017-11-30 VITALS — BP 112/60 | HR 77 | Temp 98.5°F | Resp 18 | Ht 63.94 in | Wt 153.8 lb

## 2017-11-30 DIAGNOSIS — J302 Other seasonal allergic rhinitis: Secondary | ICD-10-CM

## 2017-11-30 MED ORDER — FLUTICASONE PROPIONATE 50 MCG/ACT NA SUSP: 1.0000 | Freq: Two times a day (BID) | NASAL | 11 refills | Status: DC

## 2017-11-30 MED ORDER — MONTELUKAST SODIUM 10 MG PO TABS: 10.0000 mg | ORAL_TABLET | Freq: Every day | ORAL | 3 refills | Status: DC

## 2017-11-30 MED ORDER — CETIRIZINE HCL 10 MG PO TABS: 10.0000 mg | ORAL_TABLET | Freq: Every day | ORAL | 11 refills | Status: DC

## 2017-11-30 NOTE — Patient Instructions (Signed)
     IF you received an x-ray today, you will receive an invoice from [facility]. Please contact [facility] at [phone] with questions or concerns regarding your invoice.   IF you received labwork today, you will receive an invoice from [facility]. Please contact [facility] at [phone] with questions or concerns regarding your invoice.   Our billing staff will not be able to assist you with questions regarding bills from these companies.  You will be contacted with the lab results as soon as they are available. The fastest way to get your results is to activate your My Chart account. Instructions are located on the last page of this paperwork. If you have not heard from us regarding the results in 2 weeks, please contact this office.     

## 2017-11-30 NOTE — Progress Notes (Signed)
7/20/201910:34 AM  Wendy MosherShirl A Gin 27-May-1980, 37 y.o. female [id number]  Chief Complaint  Patient presents with  . Seasonal Allergies    Add singulair    HPI:   Patient is a 37 y.o. female with past medical history significant for seasonal allergies who presents today for followup  She is doing really well on current regime: zyrtec, flonase and singulair All sx resolved with current treatment No more nocturnal cough and SOB She has no acute concerns today  Fall Risk  11/30/2017 10/30/2017 08/15/2017 07/05/2017 04/23/2014  Falls in the past year? No No No No No     Dep[facility] screen [facility] 2/9 11/30/2017 10/30/2017 08/15/2017  Decreased Interest 0 0 0  Down, Depressed, Hopeless 0 0 0  PHQ - 2 Score 0 0 0    Allergies  Allergen Reactions  . Propranolol Other (See Comments)    Doesn't like the taste  . Sumatriptan Palpitations  . Topiramate Other (See Comments)    Tingling    Prior to Admission medications   Medication Sig Start Date End Date Taking? Authorizing Provider  ALPRAZolam Prudy Feeler(XANAX) 0.25 MG tablet Take 1-2 tablets 30-60 minutes before MRI may repeat if needed don;t drive 4/09/816/26/19  Yes Anson FretAhern, Antonia B, MD  cetirizine (ZYRTEC) 10 MG tablet Take 1 tablet (10 mg total) by mouth daily. 08/15/17  Yes Myles LippsSantiago, Alise Calais M, MD  Erenumab-aooe (AIMOVIG) 140 MG/ML SOAJ Inject 140 mg into the skin every 30 (thirty) days. 11/06/17  Yes Anson FretAhern, Antonia B, MD  fluticasone (FLONASE) 50 MCG/ACT nasal spray Place 1 spray into both nostrils 2 (two) times daily. 08/15/17  Yes Myles LippsSantiago, Kamori Barbier M, MD  montelukast (SINGULAIR) 10 MG tablet Take 1 tablet (10 mg total) by mouth at bedtime. 10/30/17  Yes Myles LippsSantiago, Shakiah Wester M, MD  prochlorperazine (COMPAZINE) 10 MG tablet Take 10 mg by mouth.   Yes [provider]  rizatriptan (MAXALT-MLT) 10 MG disintegrating tablet Take 1 tablet (10 mg total) by mouth as needed for migraine. May repeat in 2 hours if needed 11/06/17  Yes Anson FretAhern, Antonia B, MD    Past Medical  History:  Diagnosis Date  . Migraines     Past Surgical History:  Procedure Laterality Date  . LEEP    . TUBAL LIGATION      Social History   Tobacco Use  . Smoking status: Never Smoker  . Smokeless tobacco: Never Used  Substance Use Topics  . Alcohol use: Yes    Comment: occ    Family History  Problem Relation Age of Onset  . Diabetes Mother   . Stroke Mother   . Migraines Mother     ROS Per hpi  OBJECTIVE:  Blood pressure 112/60, pulse 77, temperature 98.5 F (36.9 C), temperature source Oral, resp. rate 18, height 5' 3.94" (1.624 m), weight 153 lb 12.8 oz (69.8 kg), last menstrual period 11/03/2017, SpO2 98 %.  Physical Exam  Constitutional: She is oriented to person, place, and time. She appears well-developed and well-nourished.  HENT:  Head: Normocephalic and atraumatic.  Mouth/Throat: Mucous membranes are normal.  Eyes: Pupils are equal, round, and reactive to light. EOM are normal. No scleral icterus.  Neck: Neck supple.  Pulmonary/Chest: Effort normal.  Neurological: She is alert and oriented to person, place, and time.  Skin: Skin is warm and dry.  Psychiatric: She has a normal mood and affect.  Nursing note and vitals reviewed.    ASSESSMENT and PLAN  1. Seasonal allergies Controlled. Continue current regime.   -  cetirizine (ZYRTEC) 10 MG tablet; Take 1 tablet (10 mg total) by mouth daily. - fluticasone (FLONASE) 50 MCG/ACT nasal spray; Place 1 spray into both nostrils 2 (two) times daily. - montelukast (SINGULAIR) 10 MG tablet; Take 1 tablet (10 mg total) by mouth at bedtime.  Return in about 1 year (around 12/01/2018).    Myles Lipps, MD Primary Care at [facility] [address] [place], [place] [address] Ph.  [phone] Fax [phone]

## 2017-12-06 ENCOUNTER — Encounter: Payer: Self-pay | Admitting: Family Medicine

## 2017-12-13 MED FILL — AIMOVIG 140 MG/ML SOAJ: 140 | 28 days supply | Qty: 1 | Fill #1

## 2018-01-07 ENCOUNTER — Telehealth: Payer: Self-pay | Admitting: *Deleted

## 2018-01-07 NOTE — Telephone Encounter (Signed)
Pt FMLA form on [place] desk. 

## 2018-01-08 DIAGNOSIS — Z0289 Encounter for other administrative examinations: Secondary | ICD-10-CM

## 2018-01-14 MED FILL — AIMOVIG 140 MG/ML SOAJ: 140 | 28 days supply | Qty: 1 | Fill #2

## 2018-01-14 NOTE — Telephone Encounter (Signed)
FMLA forms completed, signed and sent to medical records for processing. 

## 2018-01-15 ENCOUNTER — Telehealth: Payer: Self-pay | Admitting: *Deleted

## 2018-01-15 NOTE — Telephone Encounter (Signed)
I faxed pt [employer] form to [phone] 

## 2018-01-30 ENCOUNTER — Ambulatory Visit (INDEPENDENT_AMBULATORY_CARE_PROVIDER_SITE_OTHER): Payer: No Typology Code available for payment source | Admitting: Neurology

## 2018-01-30 DIAGNOSIS — G43711 Chronic migraine without aura, intractable, with status migrainosus: Secondary | ICD-10-CM

## 2018-01-30 NOTE — Progress Notes (Signed)
Consent Form Botulism Toxin Injection For Chronic Migraine   History:  Tremendous improvement. She has only had 1-2 headaches and 1-2 migraines in the last 3 months. She feels worse the 2 weeks She had daily headaches and at least 15 migrainous.  Daily headache disorder.  +masseters,temples,levators  Reviewed orally with patient, additionally signature is on file:  Botulism toxin has been approved by the Federal drug administration for treatment of chronic migraine. Botulism toxin does not cure chronic migraine and it may not be effective in some patients.  The administration of botulism toxin is accomplished by injecting a small amount of toxin into the muscles of the neck and head. Dosage must be titrated for each individual. Any benefits resulting from botulism toxin tend to wear off after 3 months with a repeat injection required if benefit is to be maintained. Injections are usually done every 3-4 months with maximum effect peak achieved by about 2 or 3 weeks. Botulism toxin is expensive and you should be sure of what costs you will incur resulting from the injection.  The side effects of botulism toxin use for chronic migraine may include:   -Transient, and usually mild, facial weakness with facial injections  -Transient, and usually mild, head or neck weakness with head/neck injections  -Reduction or loss of forehead facial animation due to forehead muscle weakness  -Eyelid drooping  -Dry eye  -Pain at the site of injection or bruising at the site of injection  -Double vision  -Potential unknown long term risks  Contraindications: You should not have Botox if you are pregnant, nursing, allergic to albumin, have an infection, skin condition, or muscle weakness at the site of the injection, or have myasthenia gravis, Lambert-Eaton syndrome, or ALS.  It is also possible that as with any injection, there may be an allergic reaction or no effect from the medication. Reduced  effectiveness after repeated injections is sometimes seen and rarely infection at the injection site may occur. All care will be taken to prevent these side effects. If therapy is given over a long time, atrophy and wasting in the muscle injected may occur. Occasionally the patient's become refractory to treatment because they develop antibodies to the toxin. In this event, therapy needs to be modified.  I have read the above information and consent to the administration of botulism toxin.    BOTOX PROCEDURE NOTE FOR MIGRAINE HEADACHE    Contraindications and precautions discussed with patient(above). Aseptic procedure was observed and patient tolerated procedure. Procedure performed by Dr. Artemio Alyoni Maja Mccaffery  The condition has existed for more than 6 months, and pt does not have a diagnosis of ALS, Myasthenia Gravis or Lambert-Eaton Syndrome.  Risks and benefits of injections discussed and pt agrees to proceed with the procedure.  Written consent obtained  These injections are medically necessary. Pt  receives good benefits from these injections. These injections do not cause sedations or hallucinations which the oral therapies may cause.  Indication/Diagnosis: chronic migraine BOTOX(J0585) injection was performed according to protocol by [employer]. 200 units of BOTOX was dissolved into 4 cc NS.   NDC: [address]-7800023-1145-01   Description of procedure:  The patient was placed in a sitting position. The standard protocol was used for Botox as follows, with 5 units of Botox injected at each site:   -Procerus muscle, midline injection  -Corrugator muscle, bilateral injection  -Frontalis muscle, bilateral injection, with 2 sites each side, medial injection was performed in the upper one third of the frontalis muscle, in the  region vertical from the medial inferior edge of the superior orbital rim. The lateral injection was again in the upper one third of the forehead vertically above the lateral limbus of  the cornea, 1.5 cm lateral to the medial injection site.  -Temporalis muscle injection, 4 sites, bilaterally. The first injection was 3 cm above the tragus of the ear, second injection site was 1.5 cm to 3 cm up from the first injection site in line with the tragus of the ear. The third injection site was 1.5-3 cm forward between the first 2 injection sites. The fourth injection site was 1.5 cm posterior to the second injection site.  -Occipitalis muscle injection, 3 sites, bilaterally. The first injection was done one half way between the occipital protuberance and the tip of the mastoid process behind the ear. The second injection site was done lateral and superior to the first, 1 fingerbreadth from the first injection. The third injection site was 1 fingerbreadth superiorly and medially from the first injection site.  -Cervical paraspinal muscle injection, 2 sites, bilateral knee first injection site was 1 cm from the midline of the cervical spine, 3 cm inferior to the lower border of the occipital protuberance. The second injection site was 1.5 cm superiorly and laterally to the first injection site.  -Trapezius muscle injection was performed at 3 sites, bilaterally. The first injection site was in the upper trapezius muscle halfway between the inflection point of the neck, and the acromion. The second injection site was one half way between the acromion and the first injection site. The third injection was done between the first injection site and the inflection point of the neck.   Will return for repeat injection in 3 months.   A 200 unit sof Botox was used, 155 units were injected, the rest of the Botox was wasted. The patient tolerated the procedure well, there were no complications of the above procedure.

## 2018-01-30 NOTE — Progress Notes (Signed)
Botox- 200 units x 1 vial Lot: [id number] Expiration: 01/2020 NDC: 5366-4403-470023-1145-01  Bacteriostatic 0.9% Sodium Chloride- 4mL total Lot: [id number] Expiration: 02/12/2019 NDC: [id number]  Dx: J18.841G43.711 B/B

## 2018-02-04 ENCOUNTER — Telehealth: Payer: Self-pay

## 2018-02-04 NOTE — Telephone Encounter (Signed)
Pending approval for Aimovig. Key: [id number] Rx number- [id number] ICD 10 code- G43.711 [employer]  Plan Contact [phone] phone  [phone] fax

## 2018-02-05 MED FILL — AIMOVIG 140 MG/ML SOAJ: 140 | 28 days supply | Qty: 1 | Fill #3

## 2018-02-05 NOTE — Telephone Encounter (Signed)
Received determination from [employer]. Aimovig 140 mg has been approved from 11/09/2017 through 05/09/2018. Medication must be filled or managed by a [facility] pharmacy. Please call [phone].   PA reference number: [id number] Plan Code: [id number]  For any questions contact [phone].   Called pt's pharmacy and spoke with Pieter Partridge. She is aware that Aimovig has been approved. Pt copay is $5. She is aware of approval through 05/09/2018.

## 2018-03-11 MED FILL — MONTELUKAST SOD 10 MG TAB: 10 | 30 days supply | Qty: 30 | Fill #1

## 2018-03-11 MED FILL — FLUTICASONE PROP 50 MCG SPR: 50 | 30 days supply | Qty: 16 | Fill #1

## 2018-03-17 MED FILL — AIMOVIG 140 MG/ML SOAJ: 140 | 28 days supply | Qty: 1 | Fill #4

## 2018-04-21 ENCOUNTER — Other Ambulatory Visit: Payer: Self-pay

## 2018-04-21 ENCOUNTER — Encounter: Payer: Self-pay | Admitting: Family Medicine

## 2018-04-21 ENCOUNTER — Ambulatory Visit (INDEPENDENT_AMBULATORY_CARE_PROVIDER_SITE_OTHER): Payer: No Typology Code available for payment source | Admitting: Family Medicine

## 2018-04-21 VITALS — BP 126/82 | HR 87 | Temp 99.1°F | Resp 18 | Ht 63.54 in | Wt 162.8 lb

## 2018-04-21 DIAGNOSIS — J069 Acute upper respiratory infection, unspecified: Secondary | ICD-10-CM

## 2018-04-21 DIAGNOSIS — R6889 Other general symptoms and signs: Secondary | ICD-10-CM

## 2018-04-21 LAB — POC INFLUENZA A&B (BINAX/QUICKVUE)
INFLUENZA B, POC: NEGATIVE
Influenza A, POC: NEGATIVE

## 2018-04-21 MED ORDER — FLUTICASONE PROPIONATE 50 MCG/ACT NA SUSP: 1.0000 | Freq: Two times a day (BID) | NASAL | 11 refills | Status: DC

## 2018-04-21 MED FILL — AIMOVIG 140 MG/ML SOAJ: 140 | 28 days supply | Qty: 1 | Fill #5

## 2018-04-21 MED FILL — FLUTICASONE PROP 50 MCG SPR: 50 | 30 days supply | Qty: 16 | Fill #0

## 2018-04-21 NOTE — Progress Notes (Signed)
Established Patient Office Visit  Subjective:  Patient ID: Wendy Robertson, female    DOB: 07-11-1980  Age: 37 y.o. MRN: [id number]  CC:  Chief Complaint  Patient presents with  . Cough    X 2 days- ear ache,runny rose  . Headache    X 2 days  . Generalized Body Aches    X 2 days    HPI Wendy Robertson presents for   Body ache, dry cough, ear pain on the right, headache and fatigue She reports that she feels feverish She states that this started 2 days ago  No sick contacts No nausea, vomiting or diarrhea She has been drinking tea, mucinex and excedrin   Past Medical History:  Diagnosis Date  . Migraines     Past Surgical History:  Procedure Laterality Date  . LEEP    . TUBAL LIGATION      Family History  Problem Relation Age of Onset  . Diabetes Mother   . Stroke Mother   . Migraines Mother     Social History   Socioeconomic History  . Marital status: Married    Spouse name: Not on file  . Number of children: 5  . Years of education: Not on file  . Highest education level: Not on file  Occupational History  . Not on file  Social Needs  . Financial resource strain: Not on file  . Food insecurity:    Worry: Not on file    Inability: Not on file  . Transportation needs:    Medical: Not on file    Non-medical: Not on file  Tobacco Use  . Smoking status: Never Smoker  . Smokeless tobacco: Never Used  Substance and Sexual Activity  . Alcohol use: Yes    Comment: occ  . Drug use: No  . Sexual activity: Yes    Birth control/protection: None    Comment: BTL  Lifestyle  . Physical activity:    Days per week: Not on file    Minutes per session: Not on file  . Stress: Not on file  Relationships  . Social connections:    Talks on phone: Not on file    Gets together: Not on file    Attends religious service: Not on file    Active member of club or organization: Not on file    Attends meetings of clubs or organizations: Not on file    Relationship  status: Not on file  . Intimate partner violence:    Fear of current or ex partner: Not on file    Emotionally abused: Not on file    Physically abused: Not on file    Forced sexual activity: Not on file  Other Topics Concern  . Not on file  Social History Narrative  . Not on file    Outpatient Medications Prior to Visit  Medication Sig Dispense Refill  . cetirizine (ZYRTEC) 10 MG tablet Take 1 tablet (10 mg total) by mouth daily. 30 tablet 11  . Erenumab-aooe (AIMOVIG) 140 MG/ML SOAJ Inject 140 mg into the skin every 30 (thirty) days. 1 pen 11  . montelukast (SINGULAIR) 10 MG tablet Take 1 tablet (10 mg total) by mouth at bedtime. 90 tablet 3  . prochlorperazine (COMPAZINE) 10 MG tablet Take 10 mg by mouth.    . rizatriptan (MAXALT-MLT) 10 MG disintegrating tablet Take 1 tablet (10 mg total) by mouth as needed for migraine. May repeat in 2 hours if needed 9 tablet 11  .  fluticasone (FLONASE) 50 MCG/ACT nasal spray Place 1 spray into both nostrils 2 (two) times daily. 16 g 11  . ALPRAZolam (XANAX) 0.25 MG tablet Take 1-2 tablets 30-60 minutes before MRI may repeat if needed don;t drive (Patient not taking: Reported on 04/21/2018) 5 tablet 0   No facility-administered medications prior to visit.     Allergies  Allergen Reactions  . Propranolol Other (See Comments)    Doesn't like the taste  . Sumatriptan Palpitations  . Topiramate Other (See Comments)    Tingling    ROS Review of Systems Review of Systems  Constitutional: see hpi HENT: Negative for congestion, nosebleeds, trouble swallowing and voice change.   Respiratory:see hpi   Gastrointestinal: Negative for diarrhea, nausea and vomiting.  Genitourinary: Negative for difficulty urinating, dysuria, flank pain and hematuria.  Musculoskeletal: Negative for back pain, joint swelling and neck pain.  Neurological: Negative for dizziness, speech difficulty, light-headedness and numbness.  See HPI. All other review of systems  negative.     Objective:    Physical Exam  BP 126/82   Pulse 87   Temp 99.1 F (37.3 C) (Oral)   Resp 18   Ht 5' 3.54" (1.614 m)   Wt 162 lb 12.8 oz (73.8 kg)   LMP 04/02/2018 (Exact Date)   SpO2 99%   BMI 28.35 kg/m  Wt Readings from Last 3 Encounters:  04/21/18 162 lb 12.8 oz (73.8 kg)  11/30/17 153 lb 12.8 oz (69.8 kg)  11/06/17 159 lb (72.1 kg)    General: alert, oriented, in NAD Head: normocephalic, atraumatic, no sinus tenderness Eyes: EOM intact, no scleral icterus or conjunctival injection Ears: TM clear bilaterally Nose: mucosa nonerythematous, nonedematous Throat: no pharyngeal exudate or erythema Lymph: no posterior auricular, submental or cervical lymph adenopathy Heart: normal rate, normal sinus rhythm, no murmurs Lungs: clear to auscultation bilaterally, no wheezing   Health Maintenance Due  Topic Date Due  . HIV Screening  05/10/1994    There are no preventive care reminders to display for this patient.  Lab Results  Component Value Date   TSH 1.609 02/01/2012   Lab Results  Component Value Date   WBC 7.0 07/20/2017   HGB 13.5 07/20/2017   HCT 40.1 07/20/2017   MCV 90.5 07/20/2017   PLT 400 07/20/2017   Lab Results  Component Value Date   NA 137 07/20/2017   K 3.4 (L) 07/20/2017   CO2 17 (L) 07/20/2017   GLUCOSE 128 (H) 07/20/2017   BUN 14 07/20/2017   CREATININE 0.95 07/20/2017   CALCIUM 9.3 07/20/2017   ANIONGAP 16 (H) 07/20/2017   No results found for: CHOL No results found for: HDL No results found for: LDLCALC No results found for: TRIG No results found for: CHOLHDL No results found for: RUEA5W    Assessment & Plan:   Problem List Items Addressed This Visit    None    Visit Diagnoses    Flu-like symptoms    -  Primary   Relevant Orders   POC Influenza A&B(BINAX/QUICKVUE) (Completed)   Acute URI         Discussed viral etiology Discussed otc decongestant Advised flonase for congestion  Increase  hydration Vitamin C and zinc lozenges suggested Return to clinic if symptoms worse  Meds ordered this encounter  Medications  . fluticasone (FLONASE) 50 MCG/ACT nasal spray    Sig: Place 1 spray into both nostrils 2 (two) times daily.    Dispense:  16 g    Refill:  11    Follow-up: Return if symptoms worsen or fail to improve.    Doristine Bosworth, MD

## 2018-04-21 NOTE — Patient Instructions (Signed)
° ° ° °  If you have lab work done today you will be contacted with your lab results within the next 2 weeks.  If you have not heard from us then please contact us. The fastest way to get your results is to register for My Chart. ° ° °IF you received an x-ray today, you will receive an invoice from [facility]. Please contact [facility] at [phone] with questions or concerns regarding your invoice.  ° °IF you received labwork today, you will receive an invoice from [facility]. Please contact [facility] at [phone] with questions or concerns regarding your invoice.  ° °Our billing staff will not be able to assist you with questions regarding bills from these companies. ° °You will be contacted with the lab results as soon as they are available. The fastest way to get your results is to activate your My Chart account. Instructions are located on the last page of this paperwork. If you have not heard from us regarding the results in 2 weeks, please contact this office. °  ° ° ° °

## 2018-04-24 NOTE — Telephone Encounter (Signed)
I called to make sure the patients authorization was still valid for the 12/18 apt. They requested to call me back with the information.

## 2018-04-30 ENCOUNTER — Ambulatory Visit (INDEPENDENT_AMBULATORY_CARE_PROVIDER_SITE_OTHER): Payer: No Typology Code available for payment source | Admitting: Neurology

## 2018-04-30 ENCOUNTER — Telehealth: Payer: Self-pay | Admitting: Neurology

## 2018-04-30 DIAGNOSIS — G43711 Chronic migraine without aura, intractable, with status migrainosus: Secondary | ICD-10-CM | POA: Diagnosis not present

## 2018-04-30 NOTE — Progress Notes (Signed)
Consent Form Botulism Toxin Injection For Chronic Migraine   04/30/2018:  Tremendous improvement. She has only had 3 headaches and 1  migraines in the last 3 months. She feels worse the 2 weeks prior to next botox. At baseline she had daily headaches and at least 15 migrainous.    +masseters,temples,levators    Consent Form Botulism Toxin Injection For Chronic Migraine    Reviewed orally with patient, additionally signature is on file:  Botulism toxin has been approved by the Federal drug administration for treatment of chronic migraine. Botulism toxin does not cure chronic migraine and it may not be effective in some patients.  The administration of botulism toxin is accomplished by injecting a small amount of toxin into the muscles of the neck and head. Dosage must be titrated for each individual. Any benefits resulting from botulism toxin tend to wear off after 3 months with a repeat injection required if benefit is to be maintained. Injections are usually done every 3-4 months with maximum effect peak achieved by about 2 or 3 weeks. Botulism toxin is expensive and you should be sure of what costs you will incur resulting from the injection.  The side effects of botulism toxin use for chronic migraine may include:   -Transient, and usually mild, facial weakness with facial injections  -Transient, and usually mild, head or neck weakness with head/neck injections  -Reduction or loss of forehead facial animation due to forehead muscle weakness  -Eyelid drooping  -Dry eye  -Pain at the site of injection or bruising at the site of injection  -Double vision  -Potential unknown long term risks  Contraindications: You should not have Botox if you are pregnant, nursing, allergic to albumin, have an infection, skin condition, or muscle weakness at the site of the injection, or have myasthenia gravis, Lambert-Eaton syndrome, or ALS.  It is also possible that as with any injection, there  may be an allergic reaction or no effect from the medication. Reduced effectiveness after repeated injections is sometimes seen and rarely infection at the injection site may occur. All care will be taken to prevent these side effects. If therapy is given over a long time, atrophy and wasting in the muscle injected may occur. Occasionally the patient's become refractory to treatment because they develop antibodies to the toxin. In this event, therapy needs to be modified.  I have read the above information and consent to the administration of botulism toxin.    BOTOX PROCEDURE NOTE FOR MIGRAINE HEADACHE    Contraindications and precautions discussed with patient(above). Aseptic procedure was observed and patient tolerated procedure. Procedure performed by Dr. Artemio Alyoni Mattheus Rauls  The condition has existed for more than 6 months, and pt does not have a diagnosis of ALS, Myasthenia Gravis or Lambert-Eaton Syndrome.  Risks and benefits of injections discussed and pt agrees to proceed with the procedure.  Written consent obtained  These injections are medically necessary. Pt  receives good benefits from these injections. These injections do not cause sedations or hallucinations which the oral therapies may cause.  Description of procedure:  The patient was placed in a sitting position. The standard protocol was used for Botox as follows, with 5 units of Botox injected at each site:   -Procerus muscle, midline injection  -Corrugator muscle, bilateral injection  -Frontalis muscle, bilateral injection, with 2 sites each side, medial injection was performed in the upper one third of the frontalis muscle, in the region vertical from the medial inferior edge of the superior  orbital rim. The lateral injection was again in the upper one third of the forehead vertically above the lateral limbus of the cornea, 1.5 cm lateral to the medial injection site.  - Levator Scapulae: 5 units bilaterally  -Temporalis  muscle injection, 5 sites, bilaterally. The first injection was 3 cm above the tragus of the ear, second injection site was 1.5 cm to 3 cm up from the first injection site in line with the tragus of the ear. The third injection site was 1.5-3 cm forward between the first 2 injection sites. The fourth injection site was 1.5 cm posterior to the second injection site. 5th site laterally in the temporalis  muscleat the level of the outer canthus.  - Patient feels her clenching is a trigger for headaches. +5 units masseter bilaterally   -Occipitalis muscle injection, 3 sites, bilaterally. The first injection was done one half way between the occipital protuberance and the tip of the mastoid process behind the ear. The second injection site was done lateral and superior to the first, 1 fingerbreadth from the first injection. The third injection site was 1 fingerbreadth superiorly and medially from the first injection site.  -Cervical paraspinal muscle injection, 2 sites, bilateral knee first injection site was 1 cm from the midline of the cervical spine, 3 cm inferior to the lower border of the occipital protuberance. The second injection site was 1.5 cm superiorly and laterally to the first injection site.  -Trapezius muscle injection was performed at 3 sites, bilaterally. The first injection site was in the upper trapezius muscle halfway between the inflection point of the neck, and the acromion. The second injection site was one half way between the acromion and the first injection site. The third injection was done between the first injection site and the inflection point of the neck.   Will return for repeat injection in 3 months.   A 200 unit sof Botox was used, any Botox not injected was wasted. The patient tolerated the procedure well, there were no complications of the above procedure.

## 2018-04-30 NOTE — Telephone Encounter (Signed)
3 ms btx °

## 2018-04-30 NOTE — Progress Notes (Signed)
Botox- 100 units x 2 vials Lot: [id number] Expiration: 09/2020 NDC: 0023-1145-01  Bacteriostatic 0.9% Sodium Chloride- 4mL total Lot: [id number] Expiration: 02/12/2019 NDC: 0409-1966-02  Dx: G43.711 B/B   

## 2018-05-01 NOTE — Telephone Encounter (Signed)
I called and scheduled the patient. DW  °

## 2018-05-05 ENCOUNTER — Telehealth: Payer: Self-pay | Admitting: *Deleted

## 2018-05-05 MED FILL — CLOTRIMAZOLE-BETAMETHASONE: 1-0.05 | 14 days supply | Qty: 15 | Fill #1

## 2018-05-05 NOTE — Telephone Encounter (Signed)
Received another PA for Aimovig 140 mg. Attempted to complete on Cover My Meds under KEY: [id number]. Received this response from plan below: Prior Authorization is not required for this medication dosage form and strength at the quantity and days supply requested.

## 2018-05-23 MED FILL — AIMOVIG 140 MG/ML SOAJ: 140 | 28 days supply | Qty: 1 | Fill #6

## 2018-05-29 NOTE — Telephone Encounter (Addendum)
Received another PA request from [facility]. Completed Aimovig 140 mg PA under KEY: [id number]. Awaiting determination from [employer].   "To check for an update later, open this request again from your dashboard. If [employer] has not replied within 24 hours for urgent requests or within 48 hours for standard requests, please contact [employer] at [phone]."

## 2018-06-02 NOTE — Telephone Encounter (Signed)
Received approval notice from [employer]. Aimovig 140 mg approved from 05/23/2018 through 05/23/2019.   PA # [id number]  Letter faxed to pt's pharmacy. Received a receipt of confirmation.

## 2018-06-25 MED FILL — AIMOVIG 140 MG/ML SOAJ: 140 | 28 days supply | Qty: 1 | Fill #7 | Status: TO

## 2018-07-06 ENCOUNTER — Other Ambulatory Visit: Payer: Self-pay | Admitting: Neurology

## 2018-07-06 MED ORDER — PROCHLORPERAZINE MALEATE 10 MG PO TABS: 10.0000 mg | ORAL_TABLET | Freq: Four times a day (QID) | ORAL | 6 refills | Status: DC | PRN

## 2018-07-07 MED FILL — PROCHLORPERAZINE 10 MG TAB: 10 | 7 days supply | Qty: 30 | Fill #0

## 2018-07-16 DIAGNOSIS — Z01419 Encounter for gynecological examination (general) (routine) without abnormal findings: Secondary | ICD-10-CM | POA: Diagnosis not present

## 2018-07-16 DIAGNOSIS — Z9889 Other specified postprocedural states: Secondary | ICD-10-CM | POA: Diagnosis not present

## 2018-07-16 DIAGNOSIS — Z124 Encounter for screening for malignant neoplasm of cervix: Secondary | ICD-10-CM | POA: Diagnosis not present

## 2018-08-04 ENCOUNTER — Telehealth: Payer: Self-pay | Admitting: Neurology

## 2018-08-04 MED FILL — AIMOVIG 140 MG/ML SOAJ: 140 | 28 days supply | Qty: 1 | Fill #0

## 2018-08-05 NOTE — Telephone Encounter (Signed)
I called the patient and was directed to VM, patient has read mychart message and apt has been canceled. DW

## 2018-08-06 ENCOUNTER — Ambulatory Visit: Payer: No Typology Code available for payment source | Admitting: Neurology

## 2018-09-11 MED FILL — AIMOVIG 140 MG/ML SOAJ: 140 | 28 days supply | Qty: 1 | Fill #1

## 2018-09-17 ENCOUNTER — Telehealth (INDEPENDENT_AMBULATORY_CARE_PROVIDER_SITE_OTHER): Payer: 59 | Admitting: Family Medicine

## 2018-09-17 ENCOUNTER — Other Ambulatory Visit: Payer: Self-pay

## 2018-09-17 DIAGNOSIS — M5442 Lumbago with sciatica, left side: Secondary | ICD-10-CM | POA: Diagnosis not present

## 2018-09-17 DIAGNOSIS — G8929 Other chronic pain: Secondary | ICD-10-CM | POA: Diagnosis not present

## 2018-09-17 MED ORDER — MELOXICAM 15 MG PO TABS: 15.0000 mg | ORAL_TABLET | Freq: Every day | ORAL | 2 refills | Status: DC | PRN

## 2018-09-17 MED ORDER — CYCLOBENZAPRINE HCL 10 MG PO TABS
10.0000 mg | ORAL_TABLET | Freq: Three times a day (TID) | ORAL | 2 refills | Status: DC | PRN
Start: 2018-09-17 — End: 2019-11-27

## 2018-09-17 MED ORDER — GABAPENTIN 300 MG PO CAPS: 300.0000 mg | ORAL_CAPSULE | Freq: Three times a day (TID) | ORAL | 3 refills | Status: DC | PRN

## 2018-09-17 MED FILL — CYCLOBENZAPRINE HCL 10 MG T: 10 | 10 days supply | Qty: 30 | Fill #0

## 2018-09-17 MED FILL — GABAPENTIN 300 MG CAPSULE: 300 | 30 days supply | Qty: 90 | Fill #0

## 2018-09-17 MED FILL — MELOXICAM 15 MG TABLET: 15 | 30 days supply | Qty: 30 | Fill #0

## 2018-09-17 NOTE — Progress Notes (Signed)
Virtual Visit Note  I connected with patient on 09/17/18 at 832am by phone and verified that I am speaking with the correct person using two identifiers. Wendy Robertson is currently located at home and patient is currently with them during visit. The provider, Myles Lipps, MD is located in their office at time of visit.  I discussed the limitations, risks, security and privacy concerns of performing an evaluation and management service by telephone and the availability of in person appointments. I also discussed with the patient that there may be a patient responsible charge related to this service. The patient expressed understanding and agreed to proceed.   CC: back pain  HPI ? 38 yo F with PMH of migraines and seasonal allergies who presents today with back pain  Back pain for about a year, progressing over the past several months Back pain lower middle, does not radiate across her waistband Occasionally pain will go down her left leg to her knee When she sits for a while, when she goes to stand, she is stooped over and very slowly get straight She cant walk for more than 15 minutes wo pain She reports rare incidents when her left leg has given out, such as going down the stairs, denies any falls Denies any numbness or tingling of leg or perineum Denies any changes in bowel or bladder Has not tried anything for this pain    Allergies  Allergen Reactions  . Propranolol Other (See Comments)    Doesn't like the taste  . Sumatriptan Palpitations  . Topiramate Other (See Comments)    Tingling    Prior to Admission medications   Medication Sig Start Date End Date Taking? Authorizing Provider  ALPRAZolam Prudy Feeler) 0.25 MG tablet Take 1-2 tablets 30-60 minutes before MRI may repeat if needed don;t drive Patient not taking: Reported on 04/21/2018 11/06/17   Anson Fret, MD  cetirizine (ZYRTEC) 10 MG tablet Take 1 tablet (10 mg total) by mouth daily. 11/30/17   Myles Lipps,  MD  Erenumab-aooe (AIMOVIG) 140 MG/ML SOAJ Inject 140 mg into the skin every 30 (thirty) days. 11/06/17   Anson Fret, MD  fluticasone (FLONASE) 50 MCG/ACT nasal spray Place 1 spray into both nostrils 2 (two) times daily. 04/21/18   Doristine Bosworth, MD  montelukast (SINGULAIR) 10 MG tablet Take 1 tablet (10 mg total) by mouth at bedtime. 11/30/17   Myles Lipps, MD  prochlorperazine (COMPAZINE) 10 MG tablet Take 1 tablet (10 mg total) by mouth every 6 (six) hours as needed for nausea or vomiting. 07/06/18   Anson Fret, MD  rizatriptan (MAXALT-MLT) 10 MG disintegrating tablet Take 1 tablet (10 mg total) by mouth as needed for migraine. May repeat in 2 hours if needed 11/06/17   Anson Fret, MD    Past Medical History:  Diagnosis Date  . Migraines     Past Surgical History:  Procedure Laterality Date  . LEEP    . TUBAL LIGATION      Social History   Tobacco Use  . Smoking status: Never Smoker  . Smokeless tobacco: Never Used  Substance Use Topics  . Alcohol use: Yes    Comment: occ    Family History  Problem Relation Age of Onset  . Diabetes Mother   . Stroke Mother   . Migraines Mother     ROS Per hpi  Objective  Vitals as reported by the patient: none   ASSESSMENT and PLAN 1.  Chronic midline low back pain with left-sided sciatica Discussed supportive measures, new meds r/se/b and RTC precautions. Patient educational handout given. - Ambulatory referral to Physical Therapy  Other orders - meloxicam (MOBIC) 15 MG tablet; Take 1 tablet (15 mg total) by mouth daily as needed for pain. - cyclobenzaprine (FLEXERIL) 10 MG tablet; Take 1 tablet (10 mg total) by mouth 3 (three) times daily as needed for muscle spasms. - gabapentin (NEURONTIN) 300 MG capsule; Take 1 capsule (300 mg total) by mouth 3 (three) times daily as needed.  FOLLOW-UP: as needed, if no improvement or worsening of symptoms after PT completion   The above assessment and management  plan was discussed with the patient. The patient verbalized understanding of and has agreed to the management plan. Patient is aware to call the clinic if symptoms persist or worsen. Patient is aware when to return to the clinic for a follow-up visit. Patient educated on when it is appropriate to go to the emergency department.    I provided 16 minutes of non-face-to-face time during this encounter.  Myles LippsIrma M Santiago, MD [facility] [address] [place], [place] [address] Ph.  [phone] Fax [phone]

## 2018-09-17 NOTE — Patient Instructions (Signed)
Sciatica Rehab Ask your health care provider which exercises are safe for you. Do exercises exactly as told by your health care provider and adjust them as directed. It is normal to feel mild stretching, pulling, tightness, or discomfort as you do these exercises, but you should stop right away if you feel sudden pain or your pain gets worse.Do not begin these exercises until told by your health care provider. Stretching and range of motion exercises These exercises warm up your muscles and joints and improve the movement and flexibility of your hips and your back. These exercises also help to relieve pain, numbness, and tingling.  Exercise A: Sciatic nerve glide 1. Sit in a chair with your head facing down toward your chest. Place your hands behind your back. Let your shoulders slump forward. 2. Slowly straighten one of your knees while you tilt your head back as if you are looking toward the ceiling. Only straighten your leg as far as you can without making your symptoms worse. 3. Hold for ____30______ seconds. 4. Slowly return to the starting position. 5. Repeat with your other leg. Repeat _____10_____ times. Complete this exercise _____once_____ times a day.  Exercise B: Knee to chest with hip adduction and internal rotation 1. Lie on your back on a firm surface with both legs straight. 2. Bend one of your knees and move it up toward your chest until you feel a gentle stretch in your lower back and buttock. Then, move your knee toward the shoulder that is on the opposite side from your leg. ? Hold your leg in this position by holding onto the front of your knee. 3. Hold for ____30______ seconds. 4. Slowly return to the starting position. 5. Repeat with your other leg. Repeat _____10_____ times. Complete this exercise ____once______ times a day.  Exercise C: Prone extension on elbows 1. Lie on your abdomen on a firm surface. A bed may be too soft for this exercise. 2. Prop yourself up on  your elbows. 3. Use your arms to help lift your chest up until you feel a gentle stretch in your abdomen and your lower back. ? This will place some of your body weight on your elbows. If this is uncomfortable, try stacking pillows under your chest. ? Your hips should stay down, against the surface that you are lying on. Keep your hip and back muscles relaxed. 4. Hold for _____30_____ seconds. 5. Slowly relax your upper body and return to the starting position. Repeat _____10_____ times. Complete this exercise ____once______ times a day.  Strengthening exercises These exercises build strength and endurance in your back. Endurance is the ability to use your muscles for a long time, even after they get tired. Exercise D: Pelvic tilt 1. Lie on your back on a firm surface. Bend your knees and keep your feet flat. 2. Tense your abdominal muscles. Tip your pelvis up toward the ceiling and flatten your lower back into the floor. ? To help with this exercise, you may place a small towel under your lower back and try to push your back into the towel. 3. Hold for _____10_____ seconds. 4. Let your muscles relax completely before you repeat this exercise. Repeat _____10_____ times. Complete this exercise ____once______ times a day.  Exercise E: Alternating arm and leg raises 1. Get on your hands and knees on a firm surface. If you are on a hard floor, you may want to use padding to cushion your knees, such as an exercise mat. 2. Line up  your arms and legs. Your hands should be below your shoulders, and your knees should be below your hips. 3. Lift your left leg behind you. At the same time, raise your right arm and straighten it in front of you. ? Do not lift your leg higher than your hip. ? Do not lift your arm higher than your shoulder. ? Keep your abdominal and back muscles tight. ? Keep your hips facing the ground. ? Do not arch your back. ? Keep your balance carefully, and do not hold your  breath. 4. Hold for _______10___ seconds. 5. Slowly return to the starting position and repeat with your right leg and your left arm. Repeat ____10______ times. Complete this exercise ___once_______ times a day.  Posture and body mechanics Body mechanics refers to the movements and positions of your body while you do your daily activities. Posture is part of body mechanics. Good posture and healthy body mechanics can help to relieve stress in your body's tissues and joints. Good posture means that your spine is in its natural S-curve position (your spine is neutral), your shoulders are pulled back slightly, and your head is not tipped forward. The following are general guidelines for applying improved posture and body mechanics to your everyday activities.  Standing  When standing, keep your spine neutral and your feet about hip-width apart. Keep a slight bend in your knees. Your ears, shoulders, and hips should line up.  When you do a task in which you stand in one place for a long time, place one foot up on a stable object that is 2-4 inches (5-10 cm) high, such as a footstool. This helps keep your spine neutral.  Sitting  When sitting, keep your spine neutral and keep your feet flat on the floor. Use a footrest, if necessary, and keep your thighs parallel to the floor. Avoid rounding your shoulders, and avoid tilting your head forward.  When working at a desk or a computer, keep your desk at a height where your hands are slightly lower than your elbows. Slide your chair under your desk so you are close enough to maintain good posture.  When working at a computer, place your monitor at a height where you are looking straight ahead and you do not have to tilt your head forward or downward to look at the screen.  Resting  When lying down and resting, avoid positions that are most painful for you.  If you have pain with activities such as sitting, bending, stooping, or squatting  (flexion-based activities), lie in a position in which your body does not bend very much. For example, avoid curling up on your side with your arms and knees near your chest (fetal position).  If you have pain with activities such as standing for a long time or reaching with your arms (extension-based activities), lie with your spine in a neutral position and bend your knees slightly. Try the following positions: ? Lying on your side with a pillow between your knees. ? Lying on your back with a pillow under your knees. Lifting  When lifting objects, keep your feet at least shoulder-width apart and tighten your abdominal muscles.  Bend your knees and hips and keep your spine neutral. It is important to lift using the strength of your legs, not your back. Do not lock your knees straight out.  Always ask for help to lift heavy or awkward objects.  This information is not intended to replace advice given to you by your  health care provider. Make sure you discuss any questions you have with your health care provider. Document Released: 04/30/2005 Document Revised: 01/05/2016 Document Reviewed: 01/14/2015 Elsevier Interactive Patient Education  2019 [employer]

## 2018-09-17 NOTE — Progress Notes (Signed)
Having low back for a yr, hurts to sit for any period of time. Walking causes pain in the lower back as well. Not taking anything for the pain.

## 2018-09-22 DIAGNOSIS — M545 Low back pain: Secondary | ICD-10-CM | POA: Diagnosis not present

## 2018-09-22 DIAGNOSIS — M79605 Pain in left leg: Secondary | ICD-10-CM | POA: Diagnosis not present

## 2018-09-29 DIAGNOSIS — M545 Low back pain: Secondary | ICD-10-CM | POA: Diagnosis not present

## 2018-09-29 DIAGNOSIS — M79605 Pain in left leg: Secondary | ICD-10-CM | POA: Diagnosis not present

## 2018-10-02 DIAGNOSIS — M79605 Pain in left leg: Secondary | ICD-10-CM | POA: Diagnosis not present

## 2018-10-02 DIAGNOSIS — M545 Low back pain: Secondary | ICD-10-CM | POA: Diagnosis not present

## 2018-10-09 DIAGNOSIS — M79605 Pain in left leg: Secondary | ICD-10-CM | POA: Diagnosis not present

## 2018-10-09 DIAGNOSIS — M545 Low back pain: Secondary | ICD-10-CM | POA: Diagnosis not present

## 2018-10-27 DIAGNOSIS — H524 Presbyopia: Secondary | ICD-10-CM | POA: Diagnosis not present

## 2018-11-13 DIAGNOSIS — M79605 Pain in left leg: Secondary | ICD-10-CM | POA: Diagnosis not present

## 2018-11-20 DIAGNOSIS — M79605 Pain in left leg: Secondary | ICD-10-CM | POA: Diagnosis not present

## 2018-11-20 DIAGNOSIS — M545 Low back pain: Secondary | ICD-10-CM | POA: Diagnosis not present

## 2018-11-25 ENCOUNTER — Encounter: Payer: Self-pay | Admitting: *Deleted

## 2018-11-26 DIAGNOSIS — M545 Low back pain: Secondary | ICD-10-CM | POA: Diagnosis not present

## 2018-11-26 DIAGNOSIS — M79605 Pain in left leg: Secondary | ICD-10-CM | POA: Diagnosis not present

## 2018-11-28 DIAGNOSIS — M79605 Pain in left leg: Secondary | ICD-10-CM | POA: Diagnosis not present

## 2018-11-28 DIAGNOSIS — M545 Low back pain: Secondary | ICD-10-CM | POA: Diagnosis not present

## 2018-12-01 ENCOUNTER — Other Ambulatory Visit: Payer: Self-pay

## 2018-12-01 ENCOUNTER — Ambulatory Visit (INDEPENDENT_AMBULATORY_CARE_PROVIDER_SITE_OTHER): Payer: 59 | Admitting: Neurology

## 2018-12-01 ENCOUNTER — Telehealth: Payer: Self-pay | Admitting: *Deleted

## 2018-12-01 ENCOUNTER — Encounter: Payer: Self-pay | Admitting: Neurology

## 2018-12-01 VITALS — BP 127/94 | HR 105 | Temp 97.8°F | Ht 62.0 in | Wt 169.0 lb

## 2018-12-01 DIAGNOSIS — G43711 Chronic migraine without aura, intractable, with status migrainosus: Secondary | ICD-10-CM

## 2018-12-01 DIAGNOSIS — G43001 Migraine without aura, not intractable, with status migrainosus: Secondary | ICD-10-CM | POA: Diagnosis not present

## 2018-12-01 MED ORDER — AJOVY 225 MG/1.5ML ~~LOC~~ SOSY: 225.0000 mg | PREFILLED_SYRINGE | SUBCUTANEOUS | 11 refills | Status: DC

## 2018-12-01 MED ORDER — KETOROLAC TROMETHAMINE 60 MG/2ML IM SOLN
60.0000 mg | Freq: Once | INTRAMUSCULAR | Status: AC
  Administered 2018-12-01: 60 mg via INTRAMUSCULAR

## 2018-12-01 NOTE — Progress Notes (Signed)
Pt given Toradol 60 mg IM injection in LUOQ of L buttock. Pt tolerated well. Bandaid applied. See MAR.   Order written for Depacon 1 gram IV x 1 to be given in infusion suite. Orders signed and taken to infusion nurse.

## 2018-12-01 NOTE — Progress Notes (Signed)
[facility]    Provider:  Dr Lucia GaskinsAhern Referring Provider: Myles LippsSantiago, Irma M, MD Primary Care Physician:  Myles LippsSantiago, Irma M, MD  CC:  Migraines  Interval update December 01, 2018: Patient was last seen November 2019 at which time she had a tremendous improvement after Botox therapy only 3 headaches and one migraine in the prior 3 months.  She reported wearing off 2 weeks prior to the next Botox treatment.  At baseline she had daily headaches and at least 15 migrainous headaches a month. A week ago she went to [employer] and had a migraine, she had numbness in both hands and both sides of the face. She had another episode the next day, she had a headache then aura then numbness, she had these episodes in high school. She has a headache that won;t go away for a week, she has tried a lot of things. Prior to Covid she was doing well but the last few months, she became frustrated with the aimovig. Change to ajovy. Restart Botox.  HPI:  Wendy Robertson is a 38 y.o. female here as a referral from Dr. Leretha PolSantiago for migraines.  Migraine started in high school.  No other significant past medical history.  Mother has migraines. No aura with migraines, can start on either side, behind the eyes, she wakes with them, caffeine can trigger them, +nausea, no vomiting, +light and sound sensitivity, moving makes it worse. They can last 2-3 days. She has daily headaches and at least 15 migrainous.  Daily headache disorder. She is on elavil. She takes compazine. No medication overuse. Has had occ aura in the past, the aura is small, spots and then expands in size, then gets big. She gets numbness in the fingers up to the arms, tongue goes numb. She has tried multiple medications. She wakes with headaches and they can be worse positionally. At this frequency and severity for over a year. Allergies can trigger.  She is having difficulty tolerating Elavil due to fatigue and cognitive impairments.  Tries to get good sleep,  exercise, discussed with her.  Significant family history of migraines.no medication overuse. No other focal neurologic deficits, associated symptoms, inciting events or modifiable factors.  Meds tried: elavil, butalbitol, zofran, phenergan, inderal, topamax, zonisamide, maxalt  Reviewed notes, labs and imaging from outside physicians, which showed:   Bmp with cr 0.95 and BUN 14  Reviewed referring physician notes.  Patient has had migraines, intractable, allergies can trigger.  They started her on a tricyclic antidepressant and Compazine.  They started her on Elavil 25 mg.  Physical and neurologic exams have been normal.  She has migraines without and with aura.  They discussed allergies and tried Zyrtec.  Headaches becoming more intense, now also including nausea, they started in high school, multiple migraines a month, with and without aura, bilateral small sharp color flashes that expand in size followed by bitemporal headache tingling of her fingertips and tongue and numbness.  By report normal head CT scan per patient however no records to review.  She was seen at [facility] on November 2018.  Reviewed these notes by neurologist diagnosed with intractable migraines without and with aura.  Neurologic exam was normal.  At that time she was started on Zonegran and Maxalt.  Review of Systems: Patient complains of symptoms per HPI as well as the following symptoms: migraines, fatigue. Pertinent negatives and positives per HPI. All others negative.   Social History   Socioeconomic History   Marital status: Married  Spouse name: Not on file   Number of children: 5   Years of education: Not on file   Highest education level: Not on file  Occupational History   Not on file  Social Needs   Financial resource strain: Not on file   Food insecurity    Worry: Not on file    Inability: Not on file   Transportation needs    Medical: Not on file    Non-medical: Not on file  Tobacco  Use   Smoking status: Never Smoker   Smokeless tobacco: Never Used  Substance and Sexual Activity   Alcohol use: Yes    Comment: occ   Drug use: No   Sexual activity: Yes    Birth control/protection: None    Comment: BTL  Lifestyle   Physical activity    Days per week: Not on file    Minutes per session: Not on file   Stress: Not on file  Relationships   Social connections    Talks on phone: Not on file    Gets together: Not on file    Attends religious service: Not on file    Active member of club or organization: Not on file    Attends meetings of clubs or organizations: Not on file    Relationship status: Not on file   Intimate partner violence    Fear of current or ex partner: Not on file    Emotionally abused: Not on file    Physically abused: Not on file    Forced sexual activity: Not on file  Other Topics Concern   Not on file  Social History Narrative   Lives at home with her husband and children and 2 dogs   Right handed   Caffeine: pepsi, coffee. About 2 cups/day total.    Family History  Problem Relation Age of Onset   Diabetes Mother    Stroke Mother    Migraines Mother     Past Medical History:  Diagnosis Date   Migraines     Past Surgical History:  Procedure Laterality Date   LEEP     TUBAL LIGATION      Current Outpatient Medications  Medication Sig Dispense Refill   aspirin-acetaminophen-caffeine (EXCEDRIN MIGRAINE) 250-250-65 MG tablet Take 2 tablets by mouth as needed for headache.     cetirizine (ZYRTEC) 10 MG tablet Take 1 tablet (10 mg total) by mouth daily. 30 tablet 11   cyclobenzaprine (FLEXERIL) 10 MG tablet Take 1 tablet (10 mg total) by mouth 3 (three) times daily as needed for muscle spasms. 30 tablet 2   Erenumab-aooe (AIMOVIG) 140 MG/ML SOAJ Inject 140 mg into the skin every 30 (thirty) days. 1 pen 11   fluticasone (FLONASE) 50 MCG/ACT nasal spray Place 1 spray into both nostrils 2 (two) times daily. 16 g  11   gabapentin (NEURONTIN) 300 MG capsule Take 1 capsule (300 mg total) by mouth 3 (three) times daily as needed. 90 capsule 3   meloxicam (MOBIC) 15 MG tablet Take 1 tablet (15 mg total) by mouth daily as needed for pain. 30 tablet 2   montelukast (SINGULAIR) 10 MG tablet Take 1 tablet (10 mg total) by mouth at bedtime. 90 tablet 3   prochlorperazine (COMPAZINE) 10 MG tablet Take 1 tablet (10 mg total) by mouth every 6 (six) hours as needed for nausea or vomiting. 30 tablet 6   rizatriptan (MAXALT-MLT) 10 MG disintegrating tablet Take 1 tablet (10 mg total) by mouth as needed  for migraine. May repeat in 2 hours if needed 9 tablet 11   Fremanezumab-vfrm (AJOVY) 225 MG/1.5ML SOSY Inject 225 mg into the skin every 30 (thirty) days. 1.5 mL 11   No current facility-administered medications for this visit.     Allergies as of 12/01/2018 - Review Complete 12/01/2018  Allergen Reaction Noted   Propranolol Other (See Comments) 12/20/2015   Sumatriptan Palpitations 06/02/2015   Topiramate Other (See Comments) 06/05/2015    Vitals: BP (!) 127/94 (BP Location: Right Arm, Patient Position: Sitting)    Pulse (!) 105    Temp 97.8 F (36.6 C) Comment: taken by front staff   Ht 5\' 2"  (1.575 m)    Wt 169 lb (76.7 kg)    BMI 30.91 kg/m  Last Weight:  Wt Readings from Last 1 Encounters:  12/01/18 169 lb (76.7 kg)   Last Height:   Ht Readings from Last 1 Encounters:  12/01/18 5\' 2"  (1.575 m)   Physical exam: Exam: Gen: NAD, conversant, well nourised, well groomed                     CV: RRR, no MRG. No Carotid Bruits. No peripheral edema, warm, nontender Eyes: Conjunctivae clear without exudates or hemorrhage  Neuro: Detailed Neurologic Exam  Speech:    Speech is normal; fluent and spontaneous with normal comprehension.  Cognition:    The patient is oriented to person, place, and time;     recent and remote memory intact;     language fluent;     normal attention, concentration,      fund of knowledge Cranial Nerves:    The pupils are equal, round, and reactive to light. The fundi are normal and spontaneous venous pulsations are present. Visual fields are full to finger confrontation. Extraocular movements are intact. Trigeminal sensation is intact and the muscles of mastication are normal. The face is symmetric. The palate elevates in the midline. Hearing intact. Voice is normal. Shoulder shrug is normal. The tongue has normal motion without fasciculations.   Coordination:    Normal finger to nose and heel to shin. Normal rapid alternating movements.   Gait:    Heel-toe and tandem gait are normal.   Motor Observation:    No asymmetry, no atrophy, and no involuntary movements noted. Tone:    Normal muscle tone.    Posture:    Posture is normal. normal erect    Strength:    Strength is V/V in the upper and lower limbs.      Sensation: intact to LT     Reflex Exam:  DTR's:    Deep tendon reflexes in the upper and lower extremities are normal bilaterally.   Toes:    The toes are downgoing bilaterally.   Clonus:    Clonus is absent.       Assessment/Plan: 38 year old with chronic intractable migraines.  Has failed multiple medications.  Did exceptionally well on Botox but was interrupted by [phone] will restart this week, frustrated with aimovig change to ajovy.  Today she has a migraine, will provide a migraine cocktail to see if we can treat before botox in a few days  Meds tried: elavil, butalbitol, zofran, phenergan, inderal, topamax, zonisamide, maxalt  Meds ordered this encounter  Medications   Fremanezumab-vfrm (AJOVY) 225 MG/1.5ML SOSY    Sig: Inject 225 mg into the skin every 30 (thirty) days.    Dispense:  1.5 mL    Refill:  11    Patient  has copay card; she can have medication for $5 regardless of insurance approval or copay amount.   ketorolac (TORADOL) injection 60 mg    Discussed: To prevent or relieve headaches, try the  following: Cool Compress. Lie down and place a cool compress on your head.  Avoid headache triggers. If certain foods or odors seem to have triggered your migraines in the past, avoid them. A headache diary might help you identify triggers.  Include physical activity in your daily routine. Try a daily walk or other moderate aerobic exercise.  Manage stress. Find healthy ways to cope with the stressors, such as delegating tasks on your to-do list.  Practice relaxation techniques. Try deep breathing, yoga, massage and visualization.  Eat regularly. Eating regularly scheduled meals and maintaining a healthy diet might help prevent headaches. Also, drink plenty of fluids.  Follow a regular sleep schedule. Sleep deprivation might contribute to headaches Consider biofeedback. With this mind-body technique, you learn to control certain bodily functions -- such as muscle tension, heart rate and blood pressure -- to prevent headaches or reduce headache pain.    Proceed to emergency room if you experience new or worsening symptoms or symptoms do not resolve, if you have new neurologic symptoms or if headache is severe, or for any concerning symptom.   Provided education and documentation from American headache Society toolbox including articles on: chronic migraine medication overuse headache, chronic migraines, prevention of migraines, behavioral and other nonpharmacologic treatments for headache.  A total of 25 minutes was spent face-to-face with this patient. Over half this time was spent on counseling patient on the  1. Chronic migraine without aura, with intractable migraine, so stated, with status migrainosus    diagnosis and different diagnostic and therapeutic options, counseling and coordination of care, risks ans benefits of management, compliance, or risk factor reduction and education.      Cc: Myles LippsSantiago, Irma M, MD  Naomie DeanAntonia May Manrique, MD  [facility] [address]  [address] [place], [place] [address]  Phone [phone] Fax [phone]

## 2018-12-01 NOTE — Telephone Encounter (Signed)
Patient has been scheduled for Botox this Thursday 12/04/2018 @ 11:30 AM.

## 2018-12-02 ENCOUNTER — Telehealth: Payer: Self-pay | Admitting: *Deleted

## 2018-12-02 NOTE — Telephone Encounter (Signed)
Wendy Robertson I forgot Dr. Jaynee Eagles will not get paid per insurance office visit has to be 10 days I am working on her [name] now .

## 2018-12-02 NOTE — Telephone Encounter (Signed)
Spoke with Dr. Jaynee Eagles. OV would have to be 10+ days after botox appt but we can do a botox appt after an OV. I will leave pt as scheduled for this Thurs 7/23.  Dana aware.

## 2018-12-02 NOTE — Telephone Encounter (Signed)
Completed Ajovy 225 mg PA on CMM. Key: [id number].  Meds tried: Aimovig, elavil, butalbitol, zofran, phenergan, inderal, topamax, zonisamide, maxalt. Awaiting [employer] determination within 24-48 hours.

## 2018-12-02 NOTE — Telephone Encounter (Signed)
Wendy Robertson I called [facility] Patient does not need a PA for G43.711 or [phone] reference spoke to [employer]. 12/02/2018 and Patient can be B/B .  Wendy Robertson you can put her any where 10 days after yesterday thanks Hinton Dyer .

## 2018-12-04 ENCOUNTER — Other Ambulatory Visit: Payer: Self-pay

## 2018-12-04 ENCOUNTER — Ambulatory Visit (INDEPENDENT_AMBULATORY_CARE_PROVIDER_SITE_OTHER): Payer: 59 | Admitting: Neurology

## 2018-12-04 VITALS — Temp 98.7°F

## 2018-12-04 DIAGNOSIS — G43711 Chronic migraine without aura, intractable, with status migrainosus: Secondary | ICD-10-CM | POA: Diagnosis not present

## 2018-12-04 DIAGNOSIS — M545 Low back pain: Secondary | ICD-10-CM | POA: Diagnosis not present

## 2018-12-04 DIAGNOSIS — M79605 Pain in left leg: Secondary | ICD-10-CM | POA: Diagnosis not present

## 2018-12-04 NOTE — Telephone Encounter (Signed)
I spoke with the pt today while she was here in the office and she verbalized understanding of the plan. She will use the savings card.

## 2018-12-04 NOTE — Progress Notes (Signed)
Consent Form Botulism Toxin Injection For Chronic Migraine   12/07/2018:  Tremendous improvement. She has only had 3 headaches and 1  migraines in 3 months when on botox but have not been able to have it completed due to covid19. She feels worse the 2 weeks prior to next botox. At baseline she had daily headaches and at least 15 migrainous.    +masseters,temples,levators    Consent Form Botulism Toxin Injection For Chronic Migraine    Reviewed orally with patient, additionally signature is on file:  Botulism toxin has been approved by the Federal drug administration for treatment of chronic migraine. Botulism toxin does not cure chronic migraine and it may not be effective in some patients.  The administration of botulism toxin is accomplished by injecting a small amount of toxin into the muscles of the neck and head. Dosage must be titrated for each individual. Any benefits resulting from botulism toxin tend to wear off after 3 months with a repeat injection required if benefit is to be maintained. Injections are usually done every 3-4 months with maximum effect peak achieved by about 2 or 3 weeks. Botulism toxin is expensive and you should be sure of what costs you will incur resulting from the injection.  The side effects of botulism toxin use for chronic migraine may include:   -Transient, and usually mild, facial weakness with facial injections  -Transient, and usually mild, head or neck weakness with head/neck injections  -Reduction or loss of forehead facial animation due to forehead muscle weakness  -Eyelid drooping  -Dry eye  -Pain at the site of injection or bruising at the site of injection  -Double vision  -Potential unknown long term risks  Contraindications: You should not have Botox if you are pregnant, nursing, allergic to albumin, have an infection, skin condition, or muscle weakness at the site of the injection, or have myasthenia gravis, Lambert-Eaton syndrome, or  ALS.  It is also possible that as with any injection, there may be an allergic reaction or no effect from the medication. Reduced effectiveness after repeated injections is sometimes seen and rarely infection at the injection site may occur. All care will be taken to prevent these side effects. If therapy is given over a long time, atrophy and wasting in the muscle injected may occur. Occasionally the patient's become refractory to treatment because they develop antibodies to the toxin. In this event, therapy needs to be modified.  I have read the above information and consent to the administration of botulism toxin.    BOTOX PROCEDURE NOTE FOR MIGRAINE HEADACHE    Contraindications and precautions discussed with patient(above). Aseptic procedure was observed and patient tolerated procedure. Procedure performed by Dr. Artemio Alyoni Decarla Siemen  The condition has existed for more than 6 months, and pt does not have a diagnosis of ALS, Myasthenia Gravis or Lambert-Eaton Syndrome.  Risks and benefits of injections discussed and pt agrees to proceed with the procedure.  Written consent obtained  These injections are medically necessary. Pt  receives good benefits from these injections. These injections do not cause sedations or hallucinations which the oral therapies may cause.  Description of procedure:  The patient was placed in a sitting position. The standard protocol was used for Botox as follows, with 5 units of Botox injected at each site:   -Procerus muscle, midline injection  -Corrugator muscle, bilateral injection  -Frontalis muscle, bilateral injection, with 2 sites each side, medial injection was performed in the upper one third of the frontalis  muscle, in the region vertical from the medial inferior edge of the superior orbital rim. The lateral injection was again in the upper one third of the forehead vertically above the lateral limbus of the cornea, 1.5 cm lateral to the medial injection  site.  - Levator Scapulae: 5 units bilaterally  -Temporalis muscle injection, 5 sites, bilaterally. The first injection was 3 cm above the tragus of the ear, second injection site was 1.5 cm to 3 cm up from the first injection site in line with the tragus of the ear. The third injection site was 1.5-3 cm forward between the first 2 injection sites. The fourth injection site was 1.5 cm posterior to the second injection site. 5th site laterally in the temporalis  muscleat the level of the outer canthus.  - Patient feels her clenching is a trigger for headaches. +5 units masseter bilaterally   -Occipitalis muscle injection, 3 sites, bilaterally. The first injection was done one half way between the occipital protuberance and the tip of the mastoid process behind the ear. The second injection site was done lateral and superior to the first, 1 fingerbreadth from the first injection. The third injection site was 1 fingerbreadth superiorly and medially from the first injection site.  -Cervical paraspinal muscle injection, 2 sites, bilateral knee first injection site was 1 cm from the midline of the cervical spine, 3 cm inferior to the lower border of the occipital protuberance. The second injection site was 1.5 cm superiorly and laterally to the first injection site.  -Trapezius muscle injection was performed at 3 sites, bilaterally. The first injection site was in the upper trapezius muscle halfway between the inflection point of the neck, and the acromion. The second injection site was one half way between the acromion and the first injection site. The third injection was done between the first injection site and the inflection point of the neck.   Will return for repeat injection in 3 months.   A 200 unit sof Botox was used, any Botox not injected was wasted. The patient tolerated the procedure well, there were no complications of the above procedure.

## 2018-12-04 NOTE — Telephone Encounter (Addendum)
Ajovy denied by [employer] because pt must try Aimovig and Emgality first. I spoke with Dr. Jaynee Eagles. Pt can continue to use the savings card for now to get Ajovy.   PA Reference number: [id number]

## 2018-12-04 NOTE — Progress Notes (Signed)
Botox- 200 units x 1 vial Lot: [id number] Expiration: 07/2021 NDC: 6546-5035-46 Bacteriostatic 0.9% Sodium Chloride- 46mL total Lot: [id number] Expiration: 02/12/2019 NDC: 5170-0174-94  Dx: W96.759 B/B

## 2018-12-12 MED FILL — AJOVY 225 MG/1.5ML SOSY: 225 | 30 days supply | Qty: 2 | Fill #0

## 2019-01-20 ENCOUNTER — Other Ambulatory Visit: Payer: Self-pay | Admitting: Neurology

## 2019-01-20 MED ORDER — NURTEC 75 MG PO TBDP: 75.0000 mg | ORAL_TABLET | Freq: Every day | ORAL | 6 refills | Status: AC | PRN

## 2019-01-20 MED FILL — NURTEC 75 MG TBDP: 75 | 30 days supply | Qty: 8 | Fill #0

## 2019-01-20 MED FILL — AJOVY 225 MG/1.5ML SOSY: 225 | 30 days supply | Qty: 2 | Fill #1

## 2019-01-21 ENCOUNTER — Telehealth: Payer: Self-pay | Admitting: *Deleted

## 2019-01-21 NOTE — Telephone Encounter (Signed)
Submitted PA Nurtec 75mg  on CMM. Key: [id number] - PA Case ID: [id number]. Waiting on determination from [place].

## 2019-01-21 NOTE — Telephone Encounter (Signed)
"  The request has been approved. The authorization is effective for a maximum of 6 fills from 01/20/2019 to 07/19/2019, as long as the member is enrolled in their current health plan. The request was approved as submitted. This request is approved for up to 16 tablets per month. Please note: Renewal requires that the patient has experienced an improvement from baseline in a validated acute treatment patient-reported outcome questionnaire (e.g., Migraine Assessment of Current Therapy [Migraine-ACT]), OR the patient has experienced clinical improvement as defined by one of the following: Ability to function normally within 2 hours of dose, headache pain disappears within 2 hours of dose, or therapy works consistently in majority of migraine attacks. A written notification letter will follow with additional details."  Faxed notice of approval to [place] at [phone]. Received fax confirmation.

## 2019-01-28 ENCOUNTER — Telehealth: Payer: Self-pay | Admitting: Neurology

## 2019-01-28 NOTE — Telephone Encounter (Signed)
I called patient on behalf of debra in med records to advise patient that we have received fmla paperwork. LVM requesting she call back or come to office to pay form fee of $50.

## 2019-02-03 ENCOUNTER — Telehealth: Payer: Self-pay | Admitting: *Deleted

## 2019-02-03 DIAGNOSIS — Z0289 Encounter for other administrative examinations: Secondary | ICD-10-CM

## 2019-02-03 NOTE — Telephone Encounter (Signed)
Pt [employer] form on [place] desk.  

## 2019-02-04 NOTE — Telephone Encounter (Signed)
FMLA forms completed, signed and sent to medical records for processing. 

## 2019-02-09 ENCOUNTER — Telehealth: Payer: Self-pay | Admitting: Neurology

## 2019-02-09 NOTE — Telephone Encounter (Signed)
fmla paperwork faxed to [employer] on 02/05/19.

## 2019-02-12 ENCOUNTER — Telehealth: Payer: Self-pay

## 2019-02-12 NOTE — Telephone Encounter (Signed)
[employer] is reviewing your PA request. You may close this dialog, return to your dashboard, and perform other tasks.  To check for an update later, open this request again from your dashboard. If [employer] has not replied within 24 hours for urgent requests or within 48 hours for standard requests, please contact [employer] at [phone]. 

## 2019-02-12 NOTE — Telephone Encounter (Signed)
PA done for Ajovy on cover my meds.

## 2019-02-16 ENCOUNTER — Telehealth: Payer: Self-pay | Admitting: *Deleted

## 2019-02-16 MED FILL — NURTEC 75 MG TBDP: 75 | 30 days supply | Qty: 8 | Fill #1

## 2019-02-16 NOTE — Telephone Encounter (Signed)
D/t MD being out of office in the afternoon on 10/27, I called the pt & LVM (ok per DPR) asking for call back to move her botox appt to a time earlier that afternoon. Advised we currently have 1:30, 2:00, and 2:30 pm available. Left office number in message.

## 2019-02-17 NOTE — Telephone Encounter (Signed)
Deniedon October 5  This request has not been approved. Based on the information submitted for review, you did not meet our guideline rules for the requested drug. In order for your request to be approved, your provider would need to show that you have met the guideline rules below. For the preventive treatment of migraines, the guideline named [facility] (the generic name for Ajovy) requires that you have previously tried [employer]. This request was denied because your doctor did not tell us that you have tried this medication. Please work with your doctor to use a different medication or get [place] more information if it will allow [place] to approve this request. A written notification letter will follow with additional details.

## 2019-02-18 ENCOUNTER — Encounter: Payer: Self-pay | Admitting: *Deleted

## 2019-02-18 NOTE — Telephone Encounter (Signed)
Sent pt a mychart message. 

## 2019-03-09 ENCOUNTER — Telehealth: Payer: Self-pay

## 2019-03-09 NOTE — Telephone Encounter (Signed)
I called the patient to make her aware she has a bad debt balance that needs to be paid before she can be injected again. She did not answer so I left a VM asking her to call me back.

## 2019-03-10 ENCOUNTER — Ambulatory Visit: Payer: 59 | Admitting: Neurology

## 2019-03-10 ENCOUNTER — Ambulatory Visit: Payer: Self-pay | Admitting: Neurology

## 2019-04-15 MED FILL — NURTEC 75 MG TBDP: 75 | 30 days supply | Qty: 8 | Fill #2

## 2019-06-20 IMAGING — CT CT RENAL STONE PROTOCOL
2 of 4 series · 15 of 46 positions shown, 17 images · non-contrast
Comparison: None.

CLINICAL DATA: Left side flank pain

EXAM:
CT ABDOMEN AND PELVIS WITHOUT CONTRAST
TECHNIQUE: Multidetector CT imaging of the abdomen and pelvis was performed
following the standard protocol without IV contrast.

[Series 3: ap without · axial · non-contrast · 0.70mm/px · z∈[-837,-412]mm · 12 of 97 slices shown, 14 images]
[im 6/97  soft-tissue]
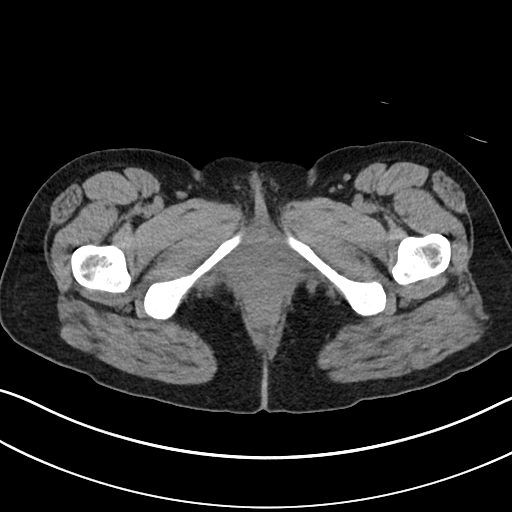
[im 6/97  bone]
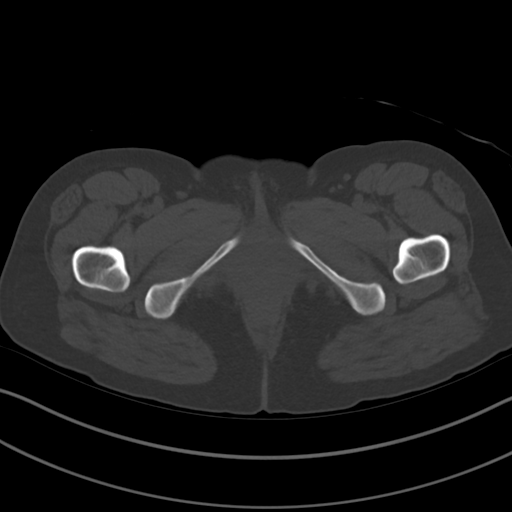
[im 16/97  soft-tissue]
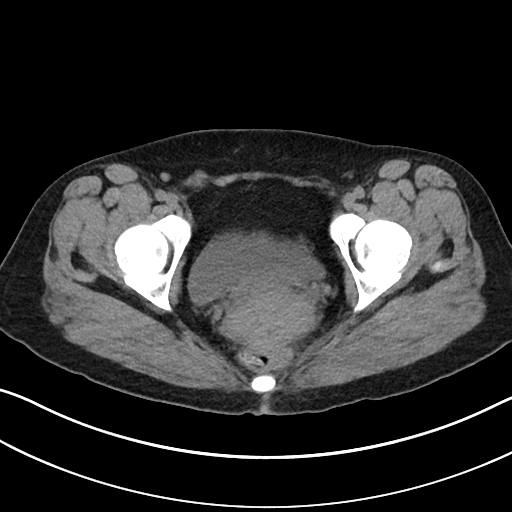
[im 21/97  soft-tissue]
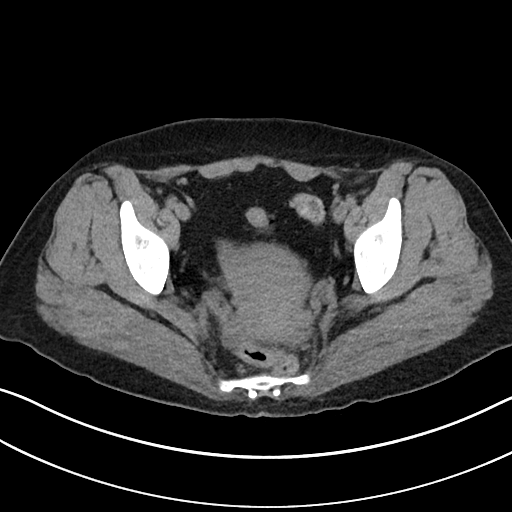
[im 31/97  soft-tissue]
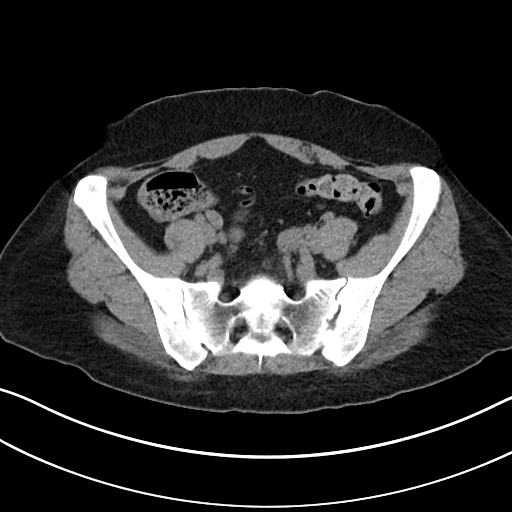
[im 36/97  soft-tissue]
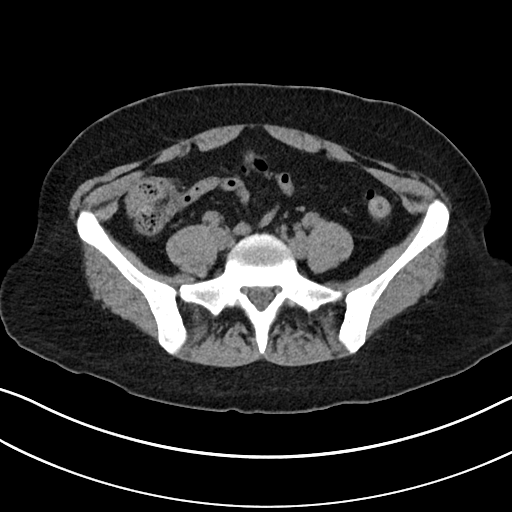
[im 46/97  soft-tissue]
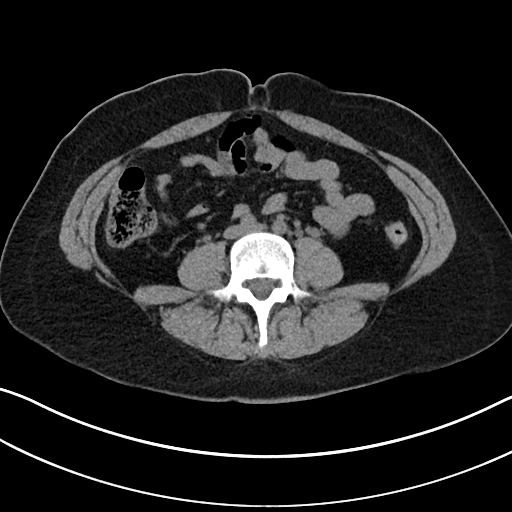
[im 51/97  soft-tissue]
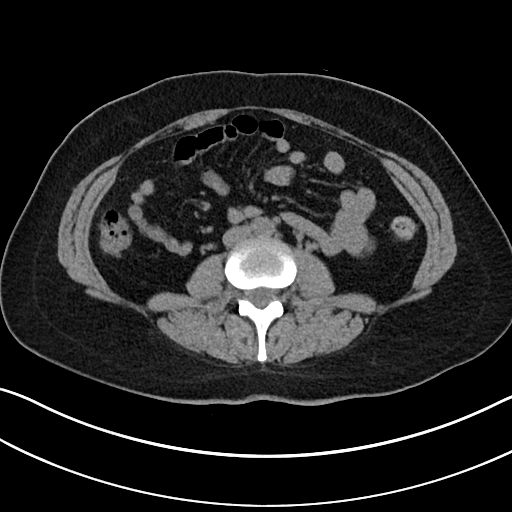
[im 61/97  soft-tissue]
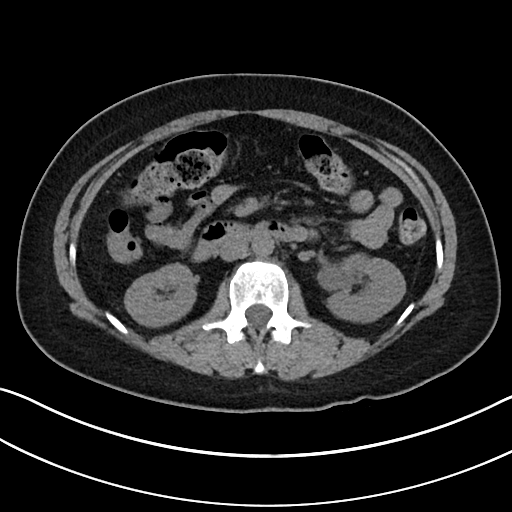
[im 66/97  soft-tissue]
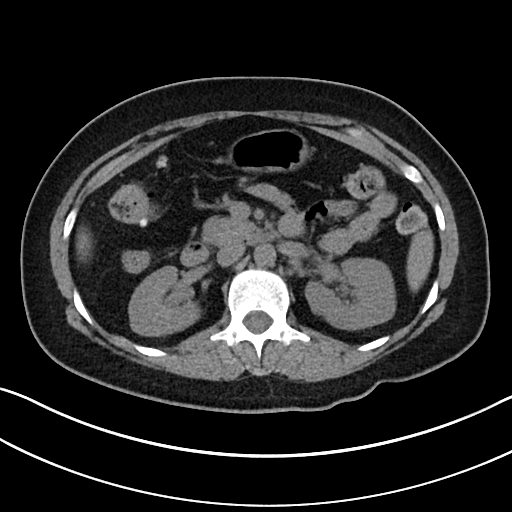
[im 66/97  bone]
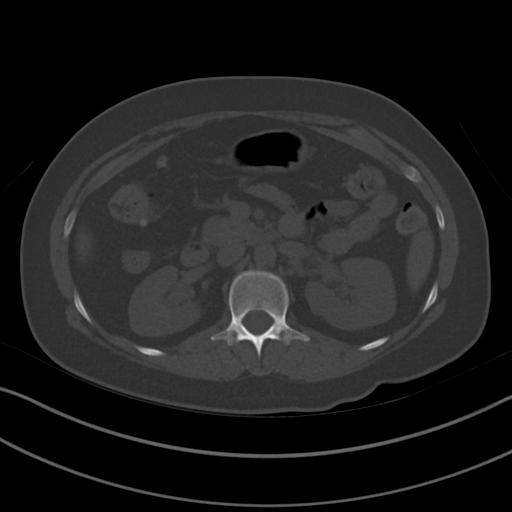
[im 76/97  soft-tissue]
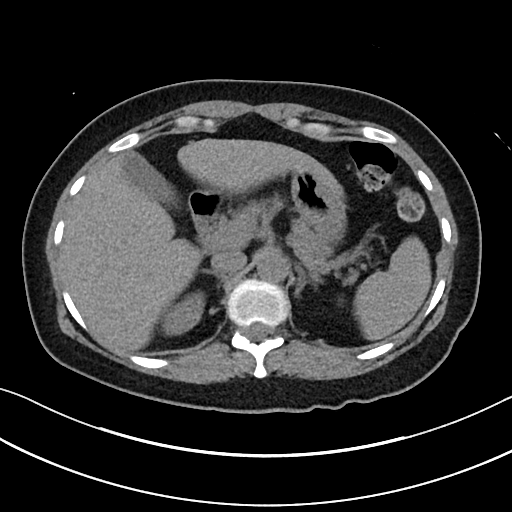
[im 81/97  soft-tissue]
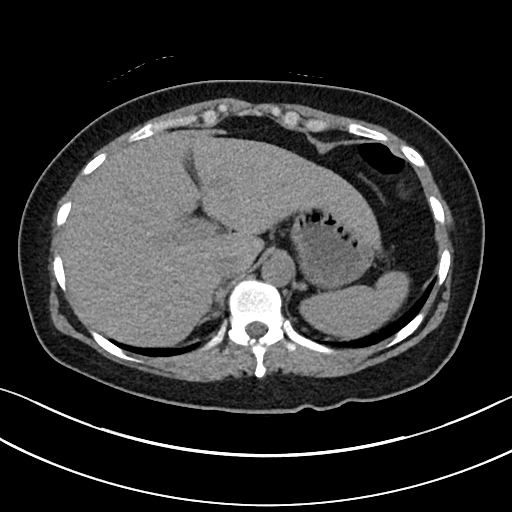
[im 91/97  soft-tissue]
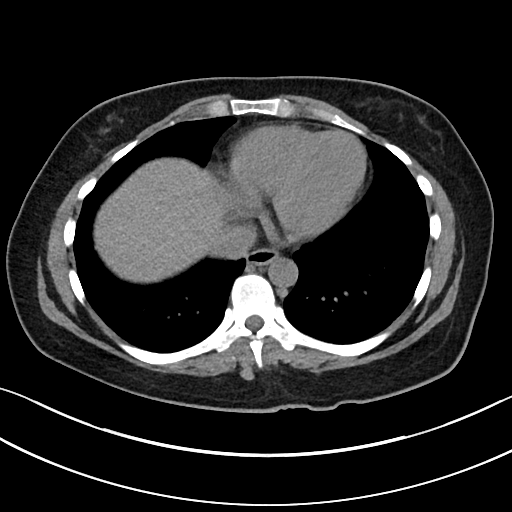

[Series 6: cor · coronal · 0.61mm/px · 3 of 74 slices shown]
[im 25/74  soft-tissue]
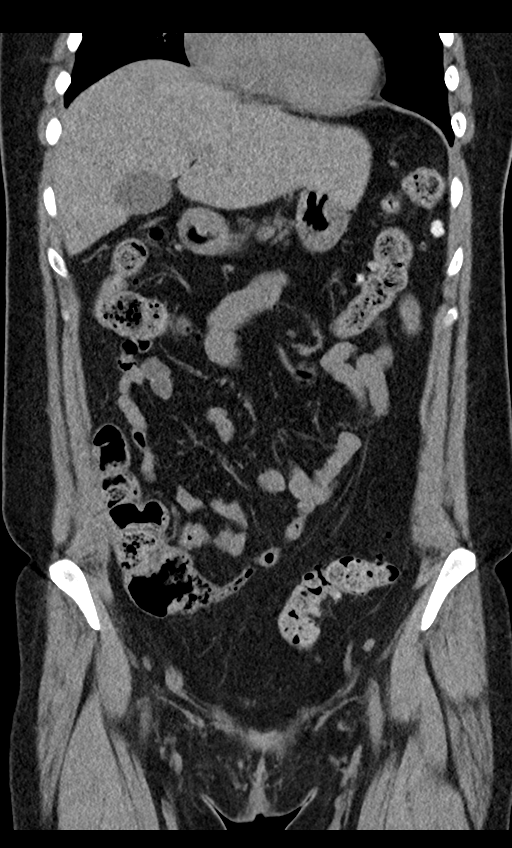
[im 33/74  soft-tissue]
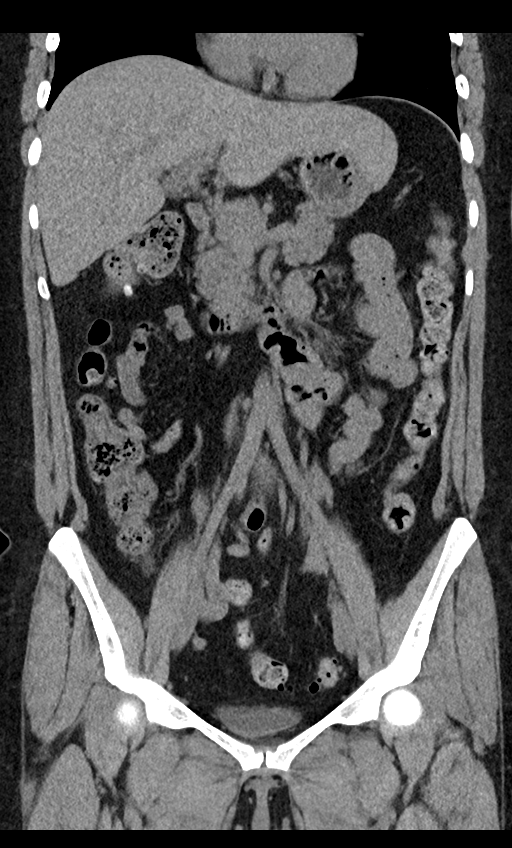
[im 41/74  soft-tissue]
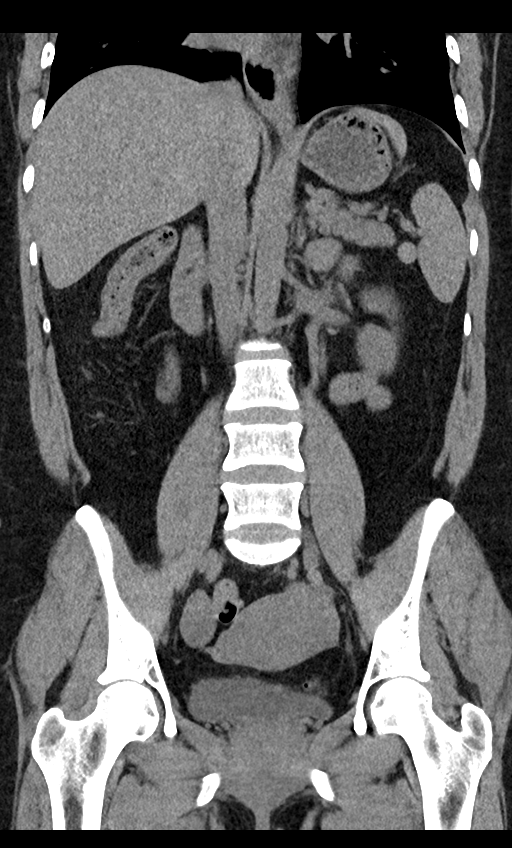

[15 of 46 positions shown; findings below may reference images not displayed]

FINDINGS: Lower chest: Lung bases are clear. No effusions. Heart is normal
size.

Hepatobiliary: No focal hepatic abnormality. Gallbladder
unremarkable.

Pancreas: No focal abnormality or ductal dilatation.

Spleen: No focal abnormality.  Normal size.

Adrenals/Urinary Tract: Mild fullness of the left renal collecting
system and slight perinephric stranding. There is a punctate 2 mm
stone layering dependently in the urinary bladder. Favor this
representing a recently passed left side stone. No ureteral stones
currently. Adrenal glands and urinary bladder unremarkable.

Stomach/Bowel: Normal appendix. Scattered colonic diverticulosis. No
active diverticulitis. Stomach and small bowel decompressed,
unremarkable.

Vascular/Lymphatic: No evidence of aneurysm or adenopathy.

Reproductive: Uterus and adnexa unremarkable.  No mass.

Other: No free fluid or free air.

Musculoskeletal: No acute bony abnormality.
IMPRESSION: Suspect recently passed 2 mm stone, now layering dependently within
the urinary bladder. Slight renal collecting system fullness and
perinephric stranding on the left.

Normal appendix.

Scattered colonic diverticulosis.  No active diverticulitis.

## 2019-07-09 ENCOUNTER — Encounter: Payer: Self-pay | Admitting: *Deleted

## 2019-07-13 ENCOUNTER — Other Ambulatory Visit: Payer: Self-pay | Admitting: *Deleted

## 2019-07-13 MED ORDER — AJOVY 225 MG/1.5ML ~~LOC~~ SOAJ: 225.0000 mg | SUBCUTANEOUS | 4 refills | Status: AC

## 2019-07-13 MED FILL — AJOVY 225 MG/1.5ML SOAJ: 225 | 30 days supply | Qty: 2 | Fill #0

## 2019-07-13 MED FILL — NURTEC 75 MG TBDP: 75 | 30 days supply | Qty: 8 | Fill #3

## 2019-07-13 NOTE — Telephone Encounter (Signed)
Ajovy 225 mg auto-injector order placed in chart and the syringe order was canceled.

## 2019-07-13 NOTE — Telephone Encounter (Signed)
Completed renewal PA for Nurtec. Key: [id number]. Awaiting determination from [employer].

## 2019-07-13 NOTE — Addendum Note (Signed)
Addended by: Bertram Savin on: 07/13/2019 09:47 AM   Modules accepted: Orders

## 2019-07-14 NOTE — Telephone Encounter (Signed)
Received Nurtec approval notice from [employer]. Aproved 07/14/19 through 07/12/20. Pharmacy notified via fax. Received a receipt of confirmation.

## 2019-07-16 NOTE — Telephone Encounter (Signed)
Ajovy 225 mg Auto-injector PA completed on Cover My Meds. Key: [id number]. Awaiting determination. PAs were previously denied d/t insurance requirement to try [employer]. However pt will be able to use the savings card for a $5 copay/month.

## 2019-07-20 ENCOUNTER — Encounter: Payer: Self-pay | Admitting: *Deleted

## 2019-07-20 NOTE — Telephone Encounter (Signed)
Ajovy denied. Plan requires Aimovig and Emgality trial first. If we should choose to appeal, fax within 180 days to [phone]. PA # [id number].

## 2019-08-27 MED FILL — AJOVY 225 MG/1.5ML SOAJ: 225 | 30 days supply | Qty: 2 | Fill #1

## 2019-08-27 MED FILL — NURTEC 75 MG TBDP: 75 | 30 days supply | Qty: 8 | Fill #4

## 2019-11-19 MED FILL — NURTEC 75 MG TBDP: 75 | 30 days supply | Qty: 8 | Fill #5

## 2019-11-27 ENCOUNTER — Other Ambulatory Visit: Payer: Self-pay

## 2019-11-27 ENCOUNTER — Telehealth (INDEPENDENT_AMBULATORY_CARE_PROVIDER_SITE_OTHER): Payer: 59 | Admitting: Family Medicine

## 2019-11-27 ENCOUNTER — Encounter: Payer: Self-pay | Admitting: Family Medicine

## 2019-11-27 DIAGNOSIS — J302 Other seasonal allergic rhinitis: Secondary | ICD-10-CM | POA: Diagnosis not present

## 2019-11-27 MED ORDER — MONTELUKAST SODIUM 10 MG PO TABS: 10.0000 mg | ORAL_TABLET | Freq: Every day | ORAL | 3 refills | Status: AC

## 2019-11-27 MED ORDER — CETIRIZINE HCL 10 MG PO TABS: 10.0000 mg | ORAL_TABLET | Freq: Every day | ORAL | 3 refills | Status: AC

## 2019-11-27 MED ORDER — FLUTICASONE PROPIONATE 50 MCG/ACT NA SUSP: 1.0000 | Freq: Two times a day (BID) | NASAL | 11 refills | Status: AC

## 2019-11-27 MED ORDER — ALBUTEROL SULFATE HFA 108 (90 BASE) MCG/ACT IN AERS: 2.0000 | INHALATION_SPRAY | Freq: Four times a day (QID) | RESPIRATORY_TRACT | 1 refills | Status: AC | PRN

## 2019-11-27 MED FILL — FLUTICASONE PROP 50 MCG SPR: 50 | 30 days supply | Qty: 16 | Fill #0

## 2019-11-27 MED FILL — MONTELUKAST SOD 10 MG TAB: 10 | 90 days supply | Qty: 90 | Fill #0

## 2019-11-27 MED FILL — ALBUTEROL SULFATE HFA 108 (: 108 (90 BAS | 24 days supply | Qty: 9 | Fill #0

## 2019-11-27 NOTE — Progress Notes (Signed)
Virtual Visit Note  I connected with patient on 11/27/19 at 225pm by phone due to technical difficulties to and verified that I am speaking with the correct person using two identifiers. Wendy Robertson is currently located at home and patient is currently with them during visit. The provider, Myles Lipps, MD is located in their office at time of visit.  I discussed the limitations, risks, security and privacy concerns of performing an evaluation and management service by telephone and the availability of in person appointments. I also discussed with the patient that there may be a patient responsible charge related to this service. The patient expressed understanding and agreed to proceed.   I provided 9 minutes of non-face-to-face time during this encounter.  Chief Complaint  Patient presents with  . Shortness of Breath    associated w/ allergies - worse with running up stairs  . Nasal Congestion    since weather has been warm     HPI ? Patient has h/o seasonal allergies She has been having increased nasal congestion specially when she is outside  She ran out of her meds (zyrtec, flonase, singulair) and now her allergies are not controlled and have triggered her migraines She has had 6 migraines this past month  She has also had to use her kids albuterol twice this past month as she was feeling chest tightness and a bit SOB/wheezing after coming back from outside She reports symptoms are present only during spring and summer, has no issues during fall and winter   Allergies  Allergen Reactions  . Propranolol Other (See Comments)    Doesn't like the taste  . Sumatriptan Palpitations  . Topiramate Other (See Comments)    Tingling    Prior to Admission medications   Medication Sig Start Date End Date Taking? Authorizing Provider  cetirizine (ZYRTEC) 10 MG tablet Take 1 tablet (10 mg total) by mouth daily. 11/30/17  Yes Myles Lipps, MD  fluticasone (FLONASE) 50 MCG/ACT  nasal spray Place 1 spray into both nostrils 2 (two) times daily. 04/21/18  Yes Stallings, Zoe A, MD  Fremanezumab-vfrm (AJOVY) 225 MG/1.5ML SOAJ Inject 225 mg into the skin every 30 (thirty) days. 07/13/19  Yes Anson Fret, MD  Rimegepant Sulfate (NURTEC) 75 MG TBDP Take 75 mg by mouth daily as needed. For migraines. Take as close to onset of migraine as possible. One daily maximum. 01/20/19  Yes Anson Fret, MD  cyclobenzaprine (FLEXERIL) 10 MG tablet Take 1 tablet (10 mg total) by mouth 3 (three) times daily as needed for muscle spasms. Patient not taking: Reported on 11/27/2019 09/17/18   Myles Lipps, MD  gabapentin (NEURONTIN) 300 MG capsule Take 1 capsule (300 mg total) by mouth 3 (three) times daily as needed. Patient not taking: Reported on 11/27/2019 09/17/18   Myles Lipps, MD  meloxicam (MOBIC) 15 MG tablet Take 1 tablet (15 mg total) by mouth daily as needed for pain. Patient not taking: Reported on 11/27/2019 09/17/18   Myles Lipps, MD  montelukast (SINGULAIR) 10 MG tablet Take 1 tablet (10 mg total) by mouth at bedtime. Patient not taking: Reported on 11/27/2019 11/30/17   Myles Lipps, MD    Past Medical History:  Diagnosis Date  . Migraines     Past Surgical History:  Procedure Laterality Date  . LEEP    . TUBAL LIGATION      Social History   Tobacco Use  . Smoking status: Never Smoker  .  Smokeless tobacco: Never Used  Substance Use Topics  . Alcohol use: Yes    Comment: occ    Family History  Problem Relation Age of Onset  . Diabetes Mother   . Stroke Mother   . Migraines Mother     ROS Per hpi  Objective  Vitals as reported by the patient: none   ASSESSMENT and PLAN  1. Seasonal allergies Uncontrolled, restart meds. Other orders - cetirizine (ZYRTEC) 10 MG tablet; Take 1 tablet (10 mg total) by mouth daily. - fluticasone (FLONASE) 50 MCG/ACT nasal spray; Place 1 spray into both nostrils 2 (two) times daily. - montelukast (SINGULAIR)  10 MG tablet; Take 1 tablet (10 mg total) by mouth at bedtime. - albuterol (VENTOLIN HFA) 108 (90 Base) MCG/ACT inhaler; Inhale 2 puffs into the lungs every 6 (six) hours as needed for wheezing or shortness of breath.  FOLLOW-UP: prn   The above assessment and management plan was discussed with the patient. The patient verbalized understanding of and has agreed to the management plan. Patient is aware to call the clinic if symptoms persist or worsen. Patient is aware when to return to the clinic for a follow-up visit. Patient educated on when it is appropriate to go to the emergency department.     Myles Lipps, MD Primary Care at [facility] [address] [place], [place] [address] Ph.  [phone] Fax [phone]

## 2019-12-03 MED FILL — AJOVY 225 MG/1.5ML SOAJ: 225 | 30 days supply | Qty: 2 | Fill #2

## 2019-12-09 ENCOUNTER — Telehealth: Payer: Self-pay

## 2019-12-09 NOTE — Telephone Encounter (Signed)
Completed Ajovy 225 mg PA on CMM. Key: [id number] - PA Case ID: [id number].  Meds tried: Aimovig, elavil, butalbitol, zofran, phenergan, inderal, topamax, zonisamide, maxalt. Awaiting [employer] determination within 24-48 hours

## 2019-12-14 NOTE — Telephone Encounter (Signed)
Received denial of Ajovy from [employer]. Insurance requires a trial and failure of Aimovig and Emgality. PA number 2842. If we should choose to appeal, fax within 180 days to [phone]. Pt should be able to use the savings card.

## 2020-02-08 DIAGNOSIS — Z049 Encounter for examination and observation for unspecified reason: Secondary | ICD-10-CM | POA: Diagnosis not present

## 2020-02-08 DIAGNOSIS — Z79899 Other long term (current) drug therapy: Secondary | ICD-10-CM | POA: Diagnosis not present

## 2020-02-08 DIAGNOSIS — G43719 Chronic migraine without aura, intractable, without status migrainosus: Secondary | ICD-10-CM | POA: Diagnosis not present

## 2020-02-08 MED FILL — BACLOFEN 10 MG TABS: 10 | 30 days supply | Qty: 20 | Fill #0

## 2020-02-08 MED FILL — ZONISAMIDE 25 MG CAPSULE: 25 | 30 days supply | Qty: 120 | Fill #0

## 2020-02-29 DIAGNOSIS — M791 Myalgia, unspecified site: Secondary | ICD-10-CM | POA: Diagnosis not present

## 2020-02-29 DIAGNOSIS — G43719 Chronic migraine without aura, intractable, without status migrainosus: Secondary | ICD-10-CM | POA: Diagnosis not present

## 2020-02-29 DIAGNOSIS — M542 Cervicalgia: Secondary | ICD-10-CM | POA: Diagnosis not present

## 2020-03-14 DIAGNOSIS — M542 Cervicalgia: Secondary | ICD-10-CM | POA: Diagnosis not present

## 2020-03-14 DIAGNOSIS — G43719 Chronic migraine without aura, intractable, without status migrainosus: Secondary | ICD-10-CM | POA: Diagnosis not present

## 2020-03-14 DIAGNOSIS — M791 Myalgia, unspecified site: Secondary | ICD-10-CM | POA: Diagnosis not present

## 2020-03-28 MED FILL — ZONISAMIDE 25 MG CAPSULE: 25 | 30 days supply | Qty: 120 | Fill #1

## 2020-04-12 DIAGNOSIS — G43719 Chronic migraine without aura, intractable, without status migrainosus: Secondary | ICD-10-CM | POA: Diagnosis not present

## 2020-04-12 DIAGNOSIS — M791 Myalgia, unspecified site: Secondary | ICD-10-CM | POA: Diagnosis not present

## 2020-04-12 DIAGNOSIS — M542 Cervicalgia: Secondary | ICD-10-CM | POA: Diagnosis not present

## 2020-04-27 MED FILL — ZONISAMIDE 25 MG CAPSULE: 25 | 30 days supply | Qty: 120 | Fill #0

## 2020-05-16 DIAGNOSIS — M791 Myalgia, unspecified site: Secondary | ICD-10-CM | POA: Diagnosis not present

## 2020-05-16 DIAGNOSIS — M542 Cervicalgia: Secondary | ICD-10-CM | POA: Diagnosis not present

## 2020-05-16 DIAGNOSIS — G43719 Chronic migraine without aura, intractable, without status migrainosus: Secondary | ICD-10-CM | POA: Diagnosis not present

## 2020-06-01 MED FILL — ZONISAMIDE 25 MG CAPSULE: 25 | 30 days supply | Qty: 120 | Fill #0

## 2020-06-08 DIAGNOSIS — M791 Myalgia, unspecified site: Secondary | ICD-10-CM | POA: Diagnosis not present

## 2020-06-08 DIAGNOSIS — M542 Cervicalgia: Secondary | ICD-10-CM | POA: Diagnosis not present

## 2020-06-08 DIAGNOSIS — G43719 Chronic migraine without aura, intractable, without status migrainosus: Secondary | ICD-10-CM | POA: Diagnosis not present

## 2020-06-08 DIAGNOSIS — R519 Headache, unspecified: Secondary | ICD-10-CM | POA: Diagnosis not present

## 2020-06-25 DIAGNOSIS — H524 Presbyopia: Secondary | ICD-10-CM | POA: Diagnosis not present

## 2020-07-06 ENCOUNTER — Other Ambulatory Visit (HOSPITAL_COMMUNITY): Payer: Self-pay | Admitting: Specialist

## 2020-07-06 MED FILL — ZONISAMIDE 25 MG CAPSULE: 25 | 30 days supply | Qty: 120 | Fill #0

## 2020-07-12 ENCOUNTER — Other Ambulatory Visit (HOSPITAL_COMMUNITY): Payer: Self-pay | Admitting: Specialist

## 2020-07-12 DIAGNOSIS — M791 Myalgia, unspecified site: Secondary | ICD-10-CM | POA: Diagnosis not present

## 2020-07-12 DIAGNOSIS — G43719 Chronic migraine without aura, intractable, without status migrainosus: Secondary | ICD-10-CM | POA: Diagnosis not present

## 2020-07-12 DIAGNOSIS — M542 Cervicalgia: Secondary | ICD-10-CM | POA: Diagnosis not present

## 2020-07-12 MED FILL — BACLOFEN 10 MG TABS: 10 | 30 days supply | Qty: 20 | Fill #0

## 2020-07-12 MED FILL — ZONISAMIDE 50 MG CAPSULE: 50 | 30 days supply | Qty: 90 | Fill #0

## 2020-08-22 MED FILL — Zonisamide Cap 50 MG: ORAL | 30 days supply | Qty: 90 | Fill #0 | Status: CN

## 2020-08-23 ENCOUNTER — Other Ambulatory Visit (HOSPITAL_COMMUNITY): Payer: Self-pay

## 2020-08-23 DIAGNOSIS — G518 Other disorders of facial nerve: Secondary | ICD-10-CM | POA: Diagnosis not present

## 2020-08-23 DIAGNOSIS — M542 Cervicalgia: Secondary | ICD-10-CM | POA: Diagnosis not present

## 2020-08-23 DIAGNOSIS — G43719 Chronic migraine without aura, intractable, without status migrainosus: Secondary | ICD-10-CM | POA: Diagnosis not present

## 2020-08-23 DIAGNOSIS — M791 Myalgia, unspecified site: Secondary | ICD-10-CM | POA: Diagnosis not present

## 2020-08-23 MED ORDER — ZONISAMIDE 100 MG PO CAPS
200.0000 mg | ORAL_CAPSULE | Freq: Every day | ORAL | 1 refills | Status: DC
  Filled 2020-08-23: qty 60, 30d supply, fill #0
  Filled 2020-09-22: qty 60, 30d supply, fill #1

## 2020-08-23 MED ORDER — TIZANIDINE HCL 4 MG PO TABS
4.0000 mg | ORAL_TABLET | Freq: Two times a day (BID) | ORAL | 1 refills | Status: AC | PRN
  Filled 2020-08-23: qty 20, 30d supply, fill #0
  Filled 2021-01-11: qty 20, 30d supply, fill #1

## 2020-08-25 ENCOUNTER — Other Ambulatory Visit (HOSPITAL_COMMUNITY): Payer: Self-pay

## 2020-09-22 ENCOUNTER — Ambulatory Visit: Payer: 59 | Admitting: Family Medicine

## 2020-09-23 ENCOUNTER — Other Ambulatory Visit (HOSPITAL_COMMUNITY): Payer: Self-pay

## 2020-10-11 ENCOUNTER — Other Ambulatory Visit (HOSPITAL_COMMUNITY): Payer: Self-pay

## 2020-10-11 DIAGNOSIS — M791 Myalgia, unspecified site: Secondary | ICD-10-CM | POA: Diagnosis not present

## 2020-10-11 DIAGNOSIS — M542 Cervicalgia: Secondary | ICD-10-CM | POA: Diagnosis not present

## 2020-10-11 DIAGNOSIS — G43719 Chronic migraine without aura, intractable, without status migrainosus: Secondary | ICD-10-CM | POA: Diagnosis not present

## 2020-10-11 MED ORDER — ZONISAMIDE 100 MG PO CAPS
200.0000 mg | ORAL_CAPSULE | Freq: Every day | ORAL | 1 refills | Status: AC
  Filled 2020-10-11 – 2020-11-08 (×2): qty 60, 30d supply, fill #0
  Filled 2021-01-11: qty 60, 30d supply, fill #1

## 2020-10-11 MED ORDER — TIZANIDINE HCL 4 MG PO TABS
4.0000 mg | ORAL_TABLET | Freq: Two times a day (BID) | ORAL | 1 refills | Status: AC | PRN
  Filled 2020-10-11: qty 20, 30d supply, fill #0

## 2020-10-19 ENCOUNTER — Other Ambulatory Visit (HOSPITAL_COMMUNITY): Payer: Self-pay

## 2020-10-27 ENCOUNTER — Ambulatory Visit: Payer: 59 | Admitting: Family Medicine

## 2020-11-08 ENCOUNTER — Other Ambulatory Visit (HOSPITAL_COMMUNITY): Payer: Self-pay

## 2021-01-11 ENCOUNTER — Other Ambulatory Visit (HOSPITAL_COMMUNITY): Payer: Self-pay

## 2021-01-19 ENCOUNTER — Other Ambulatory Visit (HOSPITAL_COMMUNITY): Payer: Self-pay

## 2021-01-23 ENCOUNTER — Other Ambulatory Visit (HOSPITAL_COMMUNITY): Payer: Self-pay

## 2021-01-23 DIAGNOSIS — G43719 Chronic migraine without aura, intractable, without status migrainosus: Secondary | ICD-10-CM | POA: Diagnosis not present

## 2021-01-23 MED ORDER — ZONISAMIDE 100 MG PO CAPS
200.0000 mg | ORAL_CAPSULE | Freq: Every day | ORAL | 2 refills | Status: AC
  Filled 2021-01-23: qty 60, 30d supply, fill #0

## 2021-01-23 MED ORDER — TIZANIDINE HCL 4 MG PO TABS
4.0000 mg | ORAL_TABLET | Freq: Two times a day (BID) | ORAL | 1 refills | Status: AC | PRN
  Filled 2021-01-23: qty 20, 30d supply, fill #0

## 2021-03-08 ENCOUNTER — Other Ambulatory Visit (HOSPITAL_COMMUNITY): Payer: Self-pay

## 2021-04-24 ENCOUNTER — Other Ambulatory Visit (HOSPITAL_COMMUNITY): Payer: Self-pay

## 2021-04-24 DIAGNOSIS — G43719 Chronic migraine without aura, intractable, without status migrainosus: Secondary | ICD-10-CM | POA: Diagnosis not present

## 2021-04-24 MED ORDER — ZONISAMIDE 100 MG PO CAPS
200.0000 mg | ORAL_CAPSULE | Freq: Every day | ORAL | 2 refills | Status: AC
  Filled 2021-04-24: qty 60, 30d supply, fill #0

## 2021-04-24 MED ORDER — ZONISAMIDE 50 MG PO CAPS
50.0000 mg | ORAL_CAPSULE | Freq: Every day | ORAL | 2 refills | Status: AC
  Filled 2021-04-24: qty 30, 30d supply, fill #0

## 2021-04-24 MED ORDER — TIZANIDINE HCL 4 MG PO TABS
4.0000 mg | ORAL_TABLET | Freq: Two times a day (BID) | ORAL | 1 refills | Status: AC | PRN
  Filled 2021-04-24: qty 20, 30d supply, fill #0

## 2021-05-02 ENCOUNTER — Other Ambulatory Visit (HOSPITAL_COMMUNITY): Payer: Self-pay

## 2021-07-24 ENCOUNTER — Other Ambulatory Visit (HOSPITAL_COMMUNITY): Payer: Self-pay

## 2021-07-24 DIAGNOSIS — G43719 Chronic migraine without aura, intractable, without status migrainosus: Secondary | ICD-10-CM | POA: Diagnosis not present

## 2021-07-24 MED ORDER — ZONISAMIDE 100 MG PO CAPS
200.0000 mg | ORAL_CAPSULE | Freq: Every day | ORAL | 2 refills | Status: AC
  Filled 2021-07-24: qty 60, 30d supply, fill #0

## 2021-07-24 MED ORDER — ZONISAMIDE 50 MG PO CAPS
50.0000 mg | ORAL_CAPSULE | Freq: Every day | ORAL | 2 refills | Status: AC
  Filled 2021-07-24: qty 30, 30d supply, fill #0

## 2021-07-24 MED ORDER — TIZANIDINE HCL 4 MG PO TABS
4.0000 mg | ORAL_TABLET | Freq: Two times a day (BID) | ORAL | 1 refills | Status: AC | PRN
  Filled 2021-07-24: qty 20, 30d supply, fill #0

## 2021-08-01 ENCOUNTER — Other Ambulatory Visit (HOSPITAL_COMMUNITY): Payer: Self-pay

## 2022-01-06 DIAGNOSIS — H524 Presbyopia: Secondary | ICD-10-CM | POA: Diagnosis not present

## 2022-02-21 ENCOUNTER — Other Ambulatory Visit (HOSPITAL_COMMUNITY): Payer: Self-pay

## 2022-02-21 MED ORDER — FLUARIX QUADRIVALENT 0.5 ML IM SUSY
0.5000 mL | PREFILLED_SYRINGE | INTRAMUSCULAR | 0 refills | Status: AC
  Filled 2022-02-21: qty 0.5, 1d supply, fill #0

## 2022-03-05 ENCOUNTER — Other Ambulatory Visit (HOSPITAL_COMMUNITY): Payer: Self-pay

## 2022-08-26 DIAGNOSIS — R030 Elevated blood-pressure reading, without diagnosis of hypertension: Secondary | ICD-10-CM | POA: Diagnosis not present

## 2022-08-26 DIAGNOSIS — R519 Headache, unspecified: Secondary | ICD-10-CM | POA: Diagnosis not present

## 2022-08-27 ENCOUNTER — Other Ambulatory Visit (HOSPITAL_COMMUNITY): Payer: Self-pay

## 2022-08-27 MED ORDER — BUTALBITAL-ASPIRIN-CAFFEINE 50-325-40 MG PO CAPS
1.0000 | ORAL_CAPSULE | Freq: Four times a day (QID) | ORAL | 0 refills | Status: AC | PRN
  Filled 2022-08-27: qty 20, 5d supply, fill #0

## 2022-08-28 ENCOUNTER — Other Ambulatory Visit (HOSPITAL_COMMUNITY): Payer: Self-pay

## 2022-11-23 ENCOUNTER — Other Ambulatory Visit: Payer: Self-pay | Admitting: Oncology

## 2022-11-23 DIAGNOSIS — Z006 Encounter for examination for normal comparison and control in clinical research program: Secondary | ICD-10-CM

## 2022-12-31 ENCOUNTER — Other Ambulatory Visit: Payer: Self-pay

## 2022-12-31 DIAGNOSIS — R82998 Other abnormal findings in urine: Secondary | ICD-10-CM | POA: Diagnosis not present

## 2022-12-31 DIAGNOSIS — J302 Other seasonal allergic rhinitis: Secondary | ICD-10-CM | POA: Diagnosis not present

## 2022-12-31 DIAGNOSIS — Z1339 Encounter for screening examination for other mental health and behavioral disorders: Secondary | ICD-10-CM | POA: Diagnosis not present

## 2022-12-31 DIAGNOSIS — Z0000 Encounter for general adult medical examination without abnormal findings: Secondary | ICD-10-CM | POA: Diagnosis not present

## 2022-12-31 DIAGNOSIS — Z1331 Encounter for screening for depression: Secondary | ICD-10-CM | POA: Diagnosis not present

## 2022-12-31 DIAGNOSIS — G43E09 Chronic migraine with aura, not intractable, without status migrainosus: Secondary | ICD-10-CM | POA: Diagnosis not present

## 2022-12-31 MED ORDER — MONTELUKAST SODIUM 10 MG PO TABS
10.0000 mg | ORAL_TABLET | Freq: Every day | ORAL | 3 refills | Status: DC
  Filled 2022-12-31: qty 90, 90d supply, fill #0
  Filled 2023-08-20: qty 90, 90d supply, fill #1

## 2022-12-31 MED ORDER — ALBUTEROL SULFATE HFA 108 (90 BASE) MCG/ACT IN AERS
1.0000 | INHALATION_SPRAY | RESPIRATORY_TRACT | 3 refills | Status: DC | PRN
  Filled 2022-12-31: qty 18, 34d supply, fill #0
  Filled 2022-12-31: qty 18, 16d supply, fill #0
  Filled 2023-08-20: qty 18, 34d supply, fill #1

## 2023-01-04 ENCOUNTER — Other Ambulatory Visit: Payer: Self-pay

## 2023-01-07 ENCOUNTER — Other Ambulatory Visit (HOSPITAL_COMMUNITY)
Admission: RE | Admit: 2023-01-07 | Discharge: 2023-01-07 | Disposition: A | Discharge status: Home / Self Care | Payer: Self-pay | Attending: Oncology | Admitting: Oncology

## 2023-01-07 DIAGNOSIS — Z006 Encounter for examination for normal comparison and control in clinical research program: Secondary | ICD-10-CM | POA: Insufficient documentation

## 2023-01-19 LAB — GENECONNECT MOLECULAR SCREEN: Genetic Analysis Overall Interpretation: NEGATIVE

## 2023-01-31 ENCOUNTER — Other Ambulatory Visit (HOSPITAL_COMMUNITY): Payer: Self-pay

## 2023-01-31 DIAGNOSIS — N904 Leukoplakia of vulva: Secondary | ICD-10-CM | POA: Diagnosis not present

## 2023-01-31 DIAGNOSIS — Z1231 Encounter for screening mammogram for malignant neoplasm of breast: Secondary | ICD-10-CM | POA: Diagnosis not present

## 2023-01-31 DIAGNOSIS — Z8742 Personal history of other diseases of the female genital tract: Secondary | ICD-10-CM | POA: Diagnosis not present

## 2023-01-31 DIAGNOSIS — Z01419 Encounter for gynecological examination (general) (routine) without abnormal findings: Secondary | ICD-10-CM | POA: Diagnosis not present

## 2023-01-31 MED ORDER — NYSTATIN-TRIAMCINOLONE 100000-0.1 UNIT/GM-% EX OINT
1.0000 | TOPICAL_OINTMENT | Freq: Two times a day (BID) | CUTANEOUS | 1 refills | Status: AC
  Filled 2023-01-31: qty 30, 15d supply, fill #0

## 2023-03-06 ENCOUNTER — Other Ambulatory Visit (HOSPITAL_COMMUNITY): Payer: Self-pay

## 2023-03-06 DIAGNOSIS — N904 Leukoplakia of vulva: Secondary | ICD-10-CM | POA: Diagnosis not present

## 2023-03-06 MED ORDER — NYSTATIN-TRIAMCINOLONE 100000-0.1 UNIT/GM-% EX OINT
1.0000 | TOPICAL_OINTMENT | Freq: Two times a day (BID) | CUTANEOUS | 1 refills | Status: AC
  Filled 2023-03-06: qty 30, 15d supply, fill #0
  Filled 2024-02-25: qty 30, 15d supply, fill #1

## 2023-05-04 DIAGNOSIS — H524 Presbyopia: Secondary | ICD-10-CM | POA: Diagnosis not present

## 2023-08-20 ENCOUNTER — Other Ambulatory Visit: Payer: Self-pay

## 2024-01-03 DIAGNOSIS — R7989 Other specified abnormal findings of blood chemistry: Secondary | ICD-10-CM | POA: Diagnosis not present

## 2024-01-03 DIAGNOSIS — Z0000 Encounter for general adult medical examination without abnormal findings: Secondary | ICD-10-CM | POA: Diagnosis not present

## 2024-01-10 ENCOUNTER — Other Ambulatory Visit: Payer: Self-pay

## 2024-01-10 DIAGNOSIS — Z1339 Encounter for screening examination for other mental health and behavioral disorders: Secondary | ICD-10-CM | POA: Diagnosis not present

## 2024-01-10 DIAGNOSIS — J302 Other seasonal allergic rhinitis: Secondary | ICD-10-CM | POA: Diagnosis not present

## 2024-01-10 DIAGNOSIS — Z0000 Encounter for general adult medical examination without abnormal findings: Secondary | ICD-10-CM | POA: Diagnosis not present

## 2024-01-10 DIAGNOSIS — Z1331 Encounter for screening for depression: Secondary | ICD-10-CM | POA: Diagnosis not present

## 2024-01-10 DIAGNOSIS — E7800 Pure hypercholesterolemia, unspecified: Secondary | ICD-10-CM | POA: Diagnosis not present

## 2024-01-10 DIAGNOSIS — G43E09 Chronic migraine with aura, not intractable, without status migrainosus: Secondary | ICD-10-CM | POA: Diagnosis not present

## 2024-01-10 DIAGNOSIS — E669 Obesity, unspecified: Secondary | ICD-10-CM | POA: Diagnosis not present

## 2024-01-10 DIAGNOSIS — R82998 Other abnormal findings in urine: Secondary | ICD-10-CM | POA: Diagnosis not present

## 2024-01-10 DIAGNOSIS — R7989 Other specified abnormal findings of blood chemistry: Secondary | ICD-10-CM | POA: Diagnosis not present

## 2024-01-10 DIAGNOSIS — G4733 Obstructive sleep apnea (adult) (pediatric): Secondary | ICD-10-CM | POA: Diagnosis not present

## 2024-01-10 MED ORDER — UBRELVY 50 MG PO TABS
50.0000 mg | ORAL_TABLET | Freq: Every day | ORAL | 11 refills | Status: AC | PRN
  Filled 2024-01-10: qty 10, 30d supply, fill #0
  Filled 2024-02-25: qty 10, 30d supply, fill #1
  Filled 2024-05-01: qty 10, 30d supply, fill #2

## 2024-01-17 ENCOUNTER — Other Ambulatory Visit: Payer: Self-pay

## 2024-01-19 ENCOUNTER — Other Ambulatory Visit: Payer: Self-pay

## 2024-01-19 DIAGNOSIS — G4733 Obstructive sleep apnea (adult) (pediatric): Secondary | ICD-10-CM | POA: Diagnosis not present

## 2024-01-19 DIAGNOSIS — R0602 Shortness of breath: Secondary | ICD-10-CM | POA: Diagnosis not present

## 2024-01-26 ENCOUNTER — Other Ambulatory Visit: Payer: Self-pay

## 2024-01-27 ENCOUNTER — Other Ambulatory Visit: Payer: Self-pay

## 2024-01-30 ENCOUNTER — Other Ambulatory Visit: Payer: Self-pay

## 2024-02-21 DIAGNOSIS — G4733 Obstructive sleep apnea (adult) (pediatric): Secondary | ICD-10-CM | POA: Diagnosis not present

## 2024-02-25 ENCOUNTER — Other Ambulatory Visit: Payer: Self-pay

## 2024-02-25 MED ORDER — MONTELUKAST SODIUM 10 MG PO TABS
10.0000 mg | ORAL_TABLET | Freq: Every day | ORAL | 3 refills | Status: AC
  Filled 2024-02-25: qty 90, 90d supply, fill #0

## 2024-02-25 MED ORDER — ALBUTEROL SULFATE HFA 108 (90 BASE) MCG/ACT IN AERS
2.0000 | INHALATION_SPRAY | RESPIRATORY_TRACT | 3 refills | Status: AC
  Filled 2024-02-25: qty 18, 17d supply, fill #0

## 2024-02-27 ENCOUNTER — Other Ambulatory Visit: Payer: Self-pay

## 2024-02-27 MED ORDER — FLUZONE 0.5 ML IM SUSY
0.5000 mL | PREFILLED_SYRINGE | Freq: Once | INTRAMUSCULAR | 0 refills | Status: AC
  Filled 2024-02-27: qty 0.5, 1d supply, fill #0

## 2024-04-03 DIAGNOSIS — G4733 Obstructive sleep apnea (adult) (pediatric): Secondary | ICD-10-CM | POA: Diagnosis not present

## 2024-04-22 DIAGNOSIS — G4733 Obstructive sleep apnea (adult) (pediatric): Secondary | ICD-10-CM | POA: Diagnosis not present

## 2024-05-11 ENCOUNTER — Other Ambulatory Visit: Payer: Self-pay
